# Patient Record
Sex: Female | Born: 1980 | Race: Black or African American | Hispanic: No | Marital: Married | Smoking: Never smoker
Health system: Southern US, Community
[De-identification: ages and names within clinical notes are randomized; demographics above are authoritative.]

## PROBLEM LIST (undated history)

## (undated) DIAGNOSIS — E162 Hypoglycemia, unspecified: Secondary | ICD-10-CM

## (undated) HISTORY — PX: BRAIN SURGERY: SHX531

---

## 2007-03-17 ENCOUNTER — Emergency Department (HOSPITAL_COMMUNITY): Admission: EM | Admit: 2007-03-17 | Discharge: 2007-03-17 | Payer: Self-pay | Admitting: Emergency Medicine

## 2007-04-27 ENCOUNTER — Emergency Department (HOSPITAL_COMMUNITY): Admission: EM | Admit: 2007-04-27 | Discharge: 2007-04-27 | Payer: Self-pay | Admitting: Emergency Medicine

## 2008-02-20 ENCOUNTER — Emergency Department (HOSPITAL_COMMUNITY): Admission: EM | Admit: 2008-02-20 | Discharge: 2008-02-20 | Payer: Self-pay | Admitting: Emergency Medicine

## 2009-02-24 ENCOUNTER — Emergency Department (HOSPITAL_COMMUNITY): Admission: EM | Admit: 2009-02-24 | Discharge: 2009-02-24 | Payer: Self-pay | Admitting: Emergency Medicine

## 2009-02-26 ENCOUNTER — Emergency Department (HOSPITAL_COMMUNITY): Admission: EM | Admit: 2009-02-26 | Discharge: 2009-02-26 | Payer: Self-pay | Admitting: Emergency Medicine

## 2010-04-18 LAB — DIFFERENTIAL
Basophils Absolute: 0.1 10*3/uL (ref 0.0–0.1)
Lymphocytes Relative: 46 % (ref 12–46)
Neutro Abs: 1.8 10*3/uL (ref 1.7–7.7)
Neutrophils Relative %: 39 % — ABNORMAL LOW (ref 43–77)

## 2010-04-18 LAB — POCT I-STAT, CHEM 8
BUN: 10 mg/dL (ref 6–23)
Calcium, Ion: 1.14 mmol/L (ref 1.12–1.32)
Chloride: 101 mEq/L (ref 96–112)
Creatinine, Ser: 1 mg/dL (ref 0.4–1.2)
Glucose, Bld: 82 mg/dL (ref 70–99)
HCT: 41 % (ref 36.0–46.0)
Potassium: 4.2 mEq/L (ref 3.5–5.1)
Sodium: 140 mEq/L (ref 135–145)

## 2010-04-18 LAB — CBC
HCT: 37.2 % (ref 36.0–46.0)
MCHC: 33 g/dL (ref 30.0–36.0)
MCV: 77.8 fL — ABNORMAL LOW (ref 78.0–100.0)
Platelets: 201 10*3/uL (ref 150–400)
RDW: 15.5 % (ref 11.5–15.5)

## 2010-09-25 LAB — URINALYSIS, ROUTINE W REFLEX MICROSCOPIC
Bilirubin Urine: NEGATIVE
Nitrite: NEGATIVE
Protein, ur: 30 — AB
Specific Gravity, Urine: 1.034 — ABNORMAL HIGH
Urobilinogen, UA: 1

## 2010-09-25 LAB — I-STAT 8, (EC8 V) (CONVERTED LAB)
Acid-Base Excess: 3 — ABNORMAL HIGH
BUN: 12
Bicarbonate: 29.1 — ABNORMAL HIGH
Chloride: 105
HCT: 43
Hemoglobin: 14.6
Potassium: 4
Sodium: 139
pCO2, Ven: 47.7

## 2010-09-25 LAB — DIFFERENTIAL
Basophils Absolute: 0
Neutro Abs: 2
Neutrophils Relative %: 41 — ABNORMAL LOW

## 2010-09-25 LAB — URINE MICROSCOPIC-ADD ON

## 2010-09-25 LAB — POCT I-STAT CREATININE
Creatinine, Ser: 1.2
Operator id: 285841

## 2010-09-25 LAB — CBC
Hemoglobin: 12.6
MCV: 77.1 — ABNORMAL LOW
RBC: 5.05

## 2010-09-26 LAB — CBC
HCT: 37.7
Hemoglobin: 12.6
MCV: 76.2 — ABNORMAL LOW
Platelets: 238
RBC: 4.95

## 2010-09-26 LAB — COMPREHENSIVE METABOLIC PANEL
AST: 29
Albumin: 4.1
Alkaline Phosphatase: 56
BUN: 10
CO2: 28
Calcium: 9.3
Creatinine, Ser: 0.87
GFR calc non Af Amer: >60
Total Protein: 8.2

## 2010-09-26 LAB — URINALYSIS, ROUTINE W REFLEX MICROSCOPIC
Bilirubin Urine: NEGATIVE
Ketones, ur: NEGATIVE
Protein, ur: NEGATIVE
Specific Gravity, Urine: 1.022
pH: 6

## 2010-09-26 LAB — URINE MICROSCOPIC-ADD ON

## 2010-09-26 LAB — DIFFERENTIAL
Basophils Relative: 0
Eosinophils Absolute: 0.1
Eosinophils Relative: 1
Lymphocytes Relative: 13
Lymphs Abs: 0.9
Monocytes Relative: 8

## 2011-06-21 ENCOUNTER — Emergency Department (HOSPITAL_COMMUNITY)
Admission: EM | Admit: 2011-06-21 | Discharge: 2011-06-21 | Disposition: A | Discharge status: Home / Self Care | Payer: Self-pay | Attending: Emergency Medicine | Admitting: Emergency Medicine

## 2011-06-21 ENCOUNTER — Encounter (HOSPITAL_COMMUNITY): Payer: Self-pay | Admitting: *Deleted

## 2011-06-21 DIAGNOSIS — R51 Headache: Secondary | ICD-10-CM | POA: Insufficient documentation

## 2011-06-21 LAB — GLUCOSE, CAPILLARY: Glucose-Capillary: 85 mg/dL (ref 70–99)

## 2011-06-21 MED ORDER — PROMETHAZINE HCL 25 MG/ML IJ SOLN
25.0000 mg | Freq: Once | INTRAMUSCULAR | Status: AC
  Administered 2011-06-21: 25 mg via INTRAVENOUS
  Filled 2011-06-21: qty 1

## 2011-06-21 MED ORDER — KETOROLAC TROMETHAMINE 30 MG/ML IJ SOLN
30.0000 mg | Freq: Once | INTRAMUSCULAR | Status: AC
  Administered 2011-06-21: 30 mg via INTRAVENOUS
  Filled 2011-06-21: qty 1

## 2011-06-21 MED ORDER — SODIUM CHLORIDE 0.9 % IV BOLUS (SEPSIS)
500.0000 mL | Freq: Once | INTRAVENOUS | Status: AC
  Administered 2011-06-21: 500 mL via INTRAVENOUS

## 2011-06-21 NOTE — Discharge Instructions (Signed)

## 2011-06-21 NOTE — Progress Notes (Signed)
Pt confirms she self pay [place] resident (not [place]) CM [place]  voiced understanding and appreciation of services and written resources provided

## 2011-06-21 NOTE — ED Notes (Signed)
Pt state she has had a headache for 3 days. Pt c/o n/v . Pt denies any blurred vision or dizziness

## 2011-06-21 NOTE — ED Provider Notes (Signed)
History     CSN: [id number]  Arrival date & time 06/21/11  1218   First MD Initiated Contact with Patient 06/21/11 1502      Chief Complaint  Patient presents with  . Migraine  . Nausea    (Consider location/radiation/quality/duration/timing/severity/associated sxs/prior treatment) Patient is a 31 y.o. female presenting with migraine. The history is provided by the patient.  Migraine This is a recurrent problem. Associated symptoms include headaches. Pertinent negatives include no chest pain, no abdominal pain and no shortness of breath.   patient's had a headache for the last 3 days. She has a previous history of headaches like this headache. She's previously had a CAT scan. She states his been coming on more frequently. She has nausea and vomiting and some photophobia. No fevers. No trauma. No vision changes. No dizziness. She states she has been thirsty all the time. She states she's had some mild urinary frequency. She denies possibility of pregnancy. No pain with urination.  History reviewed. No pertinent past medical history.  History reviewed. No pertinent past surgical history.  No family history on file.  History  Substance Use Topics  . Smoking status: Never Smoker   . Smokeless tobacco: Not on file  . Alcohol Use: No    OB History    Grav Para Term Preterm Abortions TAB SAB Ect Mult Living                  Review of Systems  Constitutional: Negative for activity change, appetite change and fatigue.  HENT: Negative for neck stiffness.   Eyes: Positive for photophobia. Negative for pain.  Respiratory: Negative for chest tightness and shortness of breath.   Cardiovascular: Negative for chest pain and leg swelling.  Gastrointestinal: Positive for nausea and vomiting. Negative for abdominal pain and diarrhea.  Genitourinary: Positive for frequency. Negative for dysuria and flank pain.  Musculoskeletal: Negative for back pain.  Skin: Negative for rash.    Neurological: Positive for headaches. Negative for weakness and numbness.  Psychiatric/Behavioral: Negative for behavioral problems.    Allergies  Penicillins  Home Medications   Current Outpatient Rx  Name Route Sig Dispense Refill  . IBUPROFEN 200 MG PO TABS Oral Take 800 mg by mouth every 6 (six) hours as needed. Pain      BP 117/51  Pulse 54  Temp 98.2 F (36.8 C) (Oral)  Resp 16  SpO2 95%  LMP 09/21/2010  Physical Exam  Nursing note and vitals reviewed. Constitutional: She is oriented to person, place, and time. She appears well-developed and well-nourished.  HENT:  Head: Normocephalic and atraumatic.       Mild tenderness over bilateral foreheads.   Eyes: EOM are normal. Pupils are equal, round, and reactive to light.  Neck: Normal range of motion. Neck supple.  Cardiovascular: Normal rate, regular rhythm and normal heart sounds.   No murmur heard. Pulmonary/Chest: Effort normal and breath sounds normal. No respiratory distress. She has no wheezes. She has no rales.  Abdominal: Soft. Bowel sounds are normal. She exhibits no distension. There is no tenderness. There is no rebound and no guarding.  Musculoskeletal: Normal range of motion.  Neurological: She is alert and oriented to person, place, and time. No cranial nerve deficit.  Skin: Skin is warm and dry.  Psychiatric: She has a normal mood and affect. Her speech is normal.    ED Course  Procedures (including critical care time)   Labs Reviewed  GLUCOSE, CAPILLARY   No results found.  1. Headache       MDM  Patient with headache for the last 3 days. Nausea vomiting. No blurred vision or dizziness. Patient feels better after treatment will be discharged home. She's been previously worked up for headaches.        Juliet Rude. Rubin Payor, MD 06/21/11 2318

## 2015-04-04 ENCOUNTER — Emergency Department (HOSPITAL_COMMUNITY)
Admission: EM | Admit: 2015-04-04 | Discharge: 2015-04-04 | Disposition: A | Discharge status: Home / Self Care | Payer: 59 | Attending: Emergency Medicine | Admitting: Emergency Medicine

## 2015-04-04 ENCOUNTER — Encounter (HOSPITAL_COMMUNITY): Payer: Self-pay | Admitting: *Deleted

## 2015-04-04 DIAGNOSIS — B349 Viral infection, unspecified: Secondary | ICD-10-CM | POA: Diagnosis not present

## 2015-04-04 DIAGNOSIS — Z880 Allergy status to penicillin: Secondary | ICD-10-CM | POA: Insufficient documentation

## 2015-04-04 DIAGNOSIS — R1084 Generalized abdominal pain: Secondary | ICD-10-CM | POA: Diagnosis not present

## 2015-04-04 DIAGNOSIS — R05 Cough: Secondary | ICD-10-CM | POA: Diagnosis present

## 2015-04-04 LAB — URINE MICROSCOPIC-ADD ON: RBC / HPF: NONE SEEN RBC/hpf (ref 0–5)

## 2015-04-04 LAB — CBC WITH DIFFERENTIAL/PLATELET
Basophils Absolute: 0 10*3/uL (ref 0.0–0.1)
Basophils Relative: 1 %
EOS ABS: 0.2 10*3/uL (ref 0.0–0.7)
EOS PCT: 5 %
HCT: 39.8 % (ref 36.0–46.0)
Hemoglobin: 12.2 g/dL (ref 12.0–15.0)
LYMPHS ABS: 1.2 10*3/uL (ref 0.7–4.0)
LYMPHS PCT: 33 %
MCH: 24.6 pg — AB (ref 26.0–34.0)
MCHC: 30.7 g/dL (ref 30.0–36.0)
MCV: 80.2 fL (ref 78.0–100.0)
MONO ABS: 0.6 10*3/uL (ref 0.1–1.0)
MONOS PCT: 16 %
Neutro Abs: 1.6 10*3/uL — ABNORMAL LOW (ref 1.7–7.7)
Neutrophils Relative %: 45 %
PLATELETS: 188 10*3/uL (ref 150–400)
RBC: 4.96 MIL/uL (ref 3.87–5.11)
RDW: 14.2 % (ref 11.5–15.5)
WBC: 3.6 10*3/uL — AB (ref 4.0–10.5)

## 2015-04-04 LAB — BASIC METABOLIC PANEL
Anion gap: 10 (ref 5–15)
BUN: 7 mg/dL (ref 6–20)
CHLORIDE: 103 mmol/L (ref 101–111)
CO2: 27 mmol/L (ref 22–32)
CREATININE: 0.95 mg/dL (ref 0.44–1.00)
Calcium: 9.1 mg/dL (ref 8.9–10.3)
GFR calc Af Amer: >60 mL/min (ref >60)
GFR calc non Af Amer: >60 mL/min (ref >60)
GLUCOSE: 78 mg/dL (ref 65–99)
POTASSIUM: 3.7 mmol/L (ref 3.5–5.1)
SODIUM: 140 mmol/L (ref 135–145)

## 2015-04-04 LAB — URINALYSIS, ROUTINE W REFLEX MICROSCOPIC
Bilirubin Urine: NEGATIVE
Glucose, UA: NEGATIVE mg/dL
Hgb urine dipstick: NEGATIVE
KETONES UR: NEGATIVE mg/dL
LEUKOCYTES UA: NEGATIVE
NITRITE: NEGATIVE
PROTEIN: 30 mg/dL — AB
Specific Gravity, Urine: 1.027 (ref 1.005–1.030)
pH: 6.5 (ref 5.0–8.0)

## 2015-04-04 LAB — LIPASE, BLOOD: LIPASE: 27 U/L (ref 11–51)

## 2015-04-04 LAB — PREGNANCY, URINE: PREG TEST UR: NEGATIVE

## 2015-04-04 MED ORDER — OSELTAMIVIR PHOSPHATE 75 MG PO CAPS: 75.0000 mg | ORAL_CAPSULE | Freq: Two times a day (BID) | ORAL | Status: AC

## 2015-04-04 MED ORDER — ONDANSETRON HCL 4 MG/2ML IJ SOLN
4.0000 mg | Freq: Once | INTRAMUSCULAR | Status: AC
  Administered 2015-04-04: 4 mg via INTRAVENOUS
  Filled 2015-04-04: qty 2

## 2015-04-04 MED ORDER — SODIUM CHLORIDE 0.9 % IV BOLUS (SEPSIS)
1000.0000 mL | Freq: Once | INTRAVENOUS | Status: AC
  Administered 2015-04-04: 1000 mL via INTRAVENOUS

## 2015-04-04 MED ORDER — ONDANSETRON 4 MG PO TBDP: 4.0000 mg | ORAL_TABLET | Freq: Three times a day (TID) | ORAL | Status: AC | PRN

## 2015-04-04 NOTE — Discharge Instructions (Signed)
1. Medications: Tamiflu, zofran for nausea as needed, continue usual home medications 2. Treatment: rest, drink plenty of fluids 3. Follow Up: Please follow up with your primary doctor in 2-3 days if no improvement for discussion of your diagnoses and further evaluation after today's visit; Please return to the ER for new or worsening symptoms, any additional concerns.

## 2015-04-04 NOTE — ED Provider Notes (Signed)
CSN: [id number]     Arrival date & time 04/04/15  1047 History  By signing my name below, I, Carla Beck, attest that this documentation has been prepared under the direction and in the presence of Carla Sauer, PA-C Electronically Signed: Soijett Beck, ED Scribe. 04/04/2015. 1:17 PM.    Chief Complaint  Patient presents with  . Influenza    The history is provided by the patient. No language interpreter was used.    Carla Beck is a 35 y.o. female who presents to the Emergency Department complaining of flu-like symptoms onset yesterday. Pt notes that her symptoms initially began with cold-like symptoms and NBNB emesis since yesterday. She states that she is having gradual onset associated symptoms of generalized body aches, productive cough x this morning, HA, nausea, vomiting x 3 episodes today, nasal congestion, sore throat, and mild abdominal pain. Pt reports that lying on her side alleviates her symptoms. She states that she has tried ibuprofen with mild relief for her symptoms. She denies fever, chills, trouble swallowing, hematemesis, diarrhea, constipation, and any other symptoms.  History reviewed. No pertinent past medical history. History reviewed. No pertinent past surgical history. History reviewed. No pertinent family history. Social History  Substance Use Topics  . Smoking status: Never Smoker   . Smokeless tobacco: None  . Alcohol Use: No   OB History    No data available     Review of Systems  HENT: Positive for congestion and sore throat.   Respiratory: Positive for cough.   Gastrointestinal: Positive for nausea and vomiting.  Musculoskeletal: Positive for myalgias.  Neurological: Positive for headaches.  All other systems reviewed and are negative.     Allergies  Ceclor and Penicillins  Home Medications   Prior to Admission medications   Medication Sig Start Date End Date Taking? Authorizing Provider  ibuprofen (ADVIL,MOTRIN) 200 MG tablet Take 800  mg by mouth every 6 (six) hours as needed. Pain    Historical Provider, MD  ondansetron (ZOFRAN ODT) 4 MG disintegrating tablet Take 1 tablet (4 mg total) by mouth every 8 (eight) hours as needed for nausea or vomiting. 04/04/15   Carla Picket Allyse Fregeau, PA-C  oseltamivir (TAMIFLU) 75 MG capsule Take 1 capsule (75 mg total) by mouth every 12 (twelve) hours. 04/04/15   Carla Papadakis Pilcher Jupiter Boys, PA-C   BP 100/59 mmHg  Pulse 60  Temp(Src) 98.3 F (36.8 C) (Oral)  Resp 16  SpO2 100%  LMP 03/06/2015 Physical Exam  Constitutional: She is oriented to person, place, and time. She appears well-developed and well-nourished. No distress.  HENT:  Head: Normocephalic and atraumatic.  OP with erythema, no exudates or tonsillar hypertrophy. + nasal congestion with mucosal edema.   Eyes: EOM are normal.  Neck: Normal range of motion. Neck supple.  No meningeal signs.   Cardiovascular: Normal rate, regular rhythm and normal heart sounds.  Exam reveals no gallop and no friction rub.   No murmur heard. Pulmonary/Chest: Effort normal and breath sounds normal. No respiratory distress. She has no wheezes. She has no rales.  Lungs are clear to auscultation bilaterally - no w/r/r  Abdominal: Soft. Bowel sounds are normal. She exhibits no distension and no mass. There is tenderness (Generalized). There is no guarding.  Musculoskeletal: Normal range of motion.  Neurological: She is alert and oriented to person, place, and time.  Skin: Skin is warm and dry. She is not diaphoretic.  Psychiatric: She has a normal mood and affect. Her behavior is normal.  Nursing note  and vitals reviewed.  ED Course  Procedures (including critical care time) DIAGNOSTIC STUDIES: Oxygen Saturation is 98% on RA, nl by my interpretation.    COORDINATION OF CARE: 1:16 PM Discussed treatment plan with pt at bedside which includes UA, labs, and pt agreed to plan.  Labs Review Labs Reviewed  CBC WITH DIFFERENTIAL/PLATELET - Abnormal; Notable  for the following:    WBC 3.6 (*)    MCH 24.6 (*)    Neutro Abs 1.6 (*)    All other components within normal limits  URINALYSIS, ROUTINE W REFLEX MICROSCOPIC (NOT AT [facility]) - Abnormal; Notable for the following:    Color, Urine AMBER (*)    APPearance TURBID (*)    Protein, ur 30 (*)    All other components within normal limits  URINE MICROSCOPIC-ADD ON - Abnormal; Notable for the following:    Squamous Epithelial / LPF 6-30 (*)    Bacteria, UA MANY (*)    All other components within normal limits  BASIC METABOLIC PANEL  PREGNANCY, URINE  LIPASE, BLOOD    Imaging Review No results found. I have personally reviewed and evaluated these lab results as part of my medical decision-making.   EKG Interpretation None      MDM   Final diagnoses:  Viral syndrome   Carla Beck presents with abdominal pain, nausea, and vomiting along with cough, congestion since yesterday. On exam, patient with nonsurgical abdomen which is soft, nondistended with generalized abdominal tenderness. Tacky mucous membranes. Oropharynx with erythema but no exudates or hypertrophy. Likely viral syndrome. CBC, BMP, lipase, UA, Upreg reviewed and reassuring. 1 L fluids and Zofran given.  3:25 PM - Patient re-evaluated and feels much improved. No episodes of emesis while in ED.   Plan: symptomatic care, PCP follow up strongly encouraged. Return precautions given. All questions answered.   I personally performed the services described in this documentation, which was scribed in my presence. The recorded information has been reviewed and is accurate.   [facility] CenterJaime Pilcher Johntay Doolen, PA-C 04/04/15 1652  Carla LovelessScott Goldston, MD 04/05/15 1149

## 2015-04-04 NOTE — ED Notes (Signed)
Pt reports flu like symptoms since Saturday. Mask on pt at triage. Reports bodyaches, headache, fever, n/v.

## 2015-04-04 NOTE — ED Notes (Signed)
PA at bedside.

## 2015-04-04 NOTE — ED Notes (Signed)
Pt reports cough, congestion, nasal congestion, n/v. NAD noted. Pt ambulatory to room.

## 2015-10-10 ENCOUNTER — Encounter (HOSPITAL_COMMUNITY): Payer: Self-pay

## 2015-10-10 ENCOUNTER — Encounter (HOSPITAL_COMMUNITY): Payer: Self-pay | Admitting: *Deleted

## 2015-10-10 ENCOUNTER — Emergency Department (HOSPITAL_COMMUNITY): Payer: 59

## 2015-10-10 ENCOUNTER — Emergency Department (HOSPITAL_COMMUNITY)
Admission: EM | Admit: 2015-10-10 | Discharge: 2015-10-10 | Disposition: A | Discharge status: Home / Self Care | Payer: 59 | Attending: Emergency Medicine | Admitting: Emergency Medicine

## 2015-10-10 DIAGNOSIS — Z5321 Procedure and treatment not carried out due to patient leaving prior to being seen by health care provider: Secondary | ICD-10-CM | POA: Insufficient documentation

## 2015-10-10 DIAGNOSIS — R112 Nausea with vomiting, unspecified: Secondary | ICD-10-CM | POA: Insufficient documentation

## 2015-10-10 DIAGNOSIS — R51 Headache: Secondary | ICD-10-CM | POA: Insufficient documentation

## 2015-10-10 DIAGNOSIS — R519 Headache, unspecified: Secondary | ICD-10-CM

## 2015-10-10 DIAGNOSIS — R109 Unspecified abdominal pain: Secondary | ICD-10-CM | POA: Insufficient documentation

## 2015-10-10 DIAGNOSIS — R1110 Vomiting, unspecified: Secondary | ICD-10-CM | POA: Insufficient documentation

## 2015-10-10 HISTORY — DX: Hypoglycemia, unspecified: E16.2

## 2015-10-10 LAB — CBC
HCT: 40.5 % (ref 36.0–46.0)
HEMATOCRIT: 38.8 % (ref 36.0–46.0)
Hemoglobin: 12.3 g/dL (ref 12.0–15.0)
Hemoglobin: 11.9 g/dL — ABNORMAL LOW (ref 12.0–15.0)
MCH: 24.7 pg — AB (ref 26.0–34.0)
MCH: 24.8 pg — AB (ref 26.0–34.0)
MCHC: 30.4 g/dL (ref 30.0–36.0)
MCHC: 30.7 g/dL (ref 30.0–36.0)
MCV: 81.3 fL (ref 78.0–100.0)
MCV: 80.8 fL (ref 78.0–100.0)
PLATELETS: 201 10*3/uL (ref 150–400)
PLATELETS: 192 10*3/uL (ref 150–400)
RBC: 4.98 MIL/uL (ref 3.87–5.11)
RBC: 4.8 MIL/uL (ref 3.87–5.11)
RDW: 14.4 % (ref 11.5–15.5)
RDW: 14.2 % (ref 11.5–15.5)
WBC: 4.1 10*3/uL (ref 4.0–10.5)
WBC: 4.8 10*3/uL (ref 4.0–10.5)

## 2015-10-10 LAB — COMPREHENSIVE METABOLIC PANEL
ALBUMIN: 3.9 g/dL (ref 3.5–5.0)
ALBUMIN: 3.7 g/dL (ref 3.5–5.0)
ALK PHOS: 48 U/L (ref 38–126)
ALT: 10 U/L — AB (ref 14–54)
ALT: 10 U/L — AB (ref 14–54)
AST: 15 U/L (ref 15–41)
AST: 14 U/L — AB (ref 15–41)
Alkaline Phosphatase: 44 U/L (ref 38–126)
Anion gap: 8 (ref 5–15)
Anion gap: 9 (ref 5–15)
BUN: 6 mg/dL (ref 6–20)
BUN: 6 mg/dL (ref 6–20)
CALCIUM: 9.2 mg/dL (ref 8.9–10.3)
CHLORIDE: 105 mmol/L (ref 101–111)
CO2: 27 mmol/L (ref 22–32)
CO2: 25 mmol/L (ref 22–32)
CREATININE: 0.99 mg/dL (ref 0.44–1.00)
CREATININE: 1.01 mg/dL — AB (ref 0.44–1.00)
Calcium: 8.8 mg/dL — ABNORMAL LOW (ref 8.9–10.3)
Chloride: 105 mmol/L (ref 101–111)
GFR calc Af Amer: >60 mL/min (ref >60)
GFR calc Af Amer: >60 mL/min (ref >60)
GFR calc non Af Amer: >60 mL/min (ref >60)
GLUCOSE: 89 mg/dL (ref 65–99)
Glucose, Bld: 84 mg/dL (ref 65–99)
POTASSIUM: 3.9 mmol/L (ref 3.5–5.1)
Potassium: 4 mmol/L (ref 3.5–5.1)
SODIUM: 140 mmol/L (ref 135–145)
SODIUM: 139 mmol/L (ref 135–145)
Total Bilirubin: 0.4 mg/dL (ref 0.3–1.2)
Total Bilirubin: 0.4 mg/dL (ref 0.3–1.2)
Total Protein: 7.6 g/dL (ref 6.5–8.1)
Total Protein: 7.3 g/dL (ref 6.5–8.1)

## 2015-10-10 LAB — I-STAT BETA HCG BLOOD, ED (MC, WL, AP ONLY)

## 2015-10-10 LAB — URINALYSIS, ROUTINE W REFLEX MICROSCOPIC
Bilirubin Urine: NEGATIVE
GLUCOSE, UA: NEGATIVE mg/dL
HGB URINE DIPSTICK: NEGATIVE
Ketones, ur: NEGATIVE mg/dL
LEUKOCYTES UA: NEGATIVE
Nitrite: NEGATIVE
PH: 6.5 (ref 5.0–8.0)
Protein, ur: NEGATIVE mg/dL
Specific Gravity, Urine: 1.024 (ref 1.005–1.030)

## 2015-10-10 LAB — LIPASE, BLOOD: Lipase: 22 U/L (ref 11–51)

## 2015-10-10 MED ORDER — KETOROLAC TROMETHAMINE 30 MG/ML IJ SOLN
30.0000 mg | Freq: Once | INTRAMUSCULAR | Status: AC
  Administered 2015-10-10: 30 mg via INTRAVENOUS
  Filled 2015-10-10: qty 1

## 2015-10-10 MED ORDER — ONDANSETRON 4 MG PO TBDP
4.0000 mg | ORAL_TABLET | Freq: Once | ORAL | Status: AC | PRN
  Administered 2015-10-10: 4 mg via ORAL

## 2015-10-10 MED ORDER — SODIUM CHLORIDE 0.9 % IV BOLUS (SEPSIS)
1000.0000 mL | Freq: Once | INTRAVENOUS | Status: AC
  Administered 2015-10-10: 1000 mL via INTRAVENOUS

## 2015-10-10 MED ORDER — METOCLOPRAMIDE HCL 5 MG/ML IJ SOLN
10.0000 mg | Freq: Once | INTRAMUSCULAR | Status: AC
  Administered 2015-10-10: 10 mg via INTRAVENOUS
  Filled 2015-10-10: qty 2

## 2015-10-10 MED ORDER — DIPHENHYDRAMINE HCL 50 MG/ML IJ SOLN
25.0000 mg | Freq: Once | INTRAMUSCULAR | Status: AC
  Administered 2015-10-10: 25 mg via INTRAVENOUS
  Filled 2015-10-10: qty 1

## 2015-10-10 MED ORDER — ONDANSETRON 4 MG PO TBDP
ORAL_TABLET | ORAL | Status: AC
  Filled 2015-10-10: qty 1

## 2015-10-10 NOTE — ED Triage Notes (Signed)
Pt states 10 episodes of vomiting since 1430 yesterday and frontal headache since 1900.  Denies blurred vision or other neuro s/s.  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

## 2015-10-10 NOTE — ED Provider Notes (Signed)
[facility]-EMERGENCY DEPT Provider Note   CSN: [id number] Arrival date & time: 10/10/15  0935  History   Chief Complaint Chief Complaint  Patient presents with  . Emesis  . Headache    HPI Relda Agosto is a 35 y.o. female.  HPI  35 y.o. female presents to the Emergency Department today complaining of headache and emesis since yesterday. Notes emesis x 10 yesterday. None today. Associated headache as well. Notes hx of headaches in past, but none as bad as this. Rates frontal headache 9/10. No relief with Excedrin. No vision changes. No dizziness. No syncope. Does not endorse any URi symptoms. No diarrhea. No CP/SOB/ABD pain. No fevers. No pain with ROM of neck. No recent sick contacts. No other symptoms noted.    Past Medical History:  Diagnosis Date  . Hypoglycemia     There are no active problems to display for this patient.   Past Surgical History:  Procedure Laterality Date  . BRAIN SURGERY      OB History    No data available       Home Medications    Prior to Admission medications   Medication Sig Start Date End Date Taking? Authorizing Provider  ibuprofen (ADVIL,MOTRIN) 200 MG tablet Take 800 mg by mouth every 6 (six) hours as needed. Pain    Historical Provider, MD  ondansetron (ZOFRAN ODT) 4 MG disintegrating tablet Take 1 tablet (4 mg total) by mouth every 8 (eight) hours as needed for nausea or vomiting. 04/04/15   Chase Picket Ward, PA-C  oseltamivir (TAMIFLU) 75 MG capsule Take 1 capsule (75 mg total) by mouth every 12 (twelve) hours. 04/04/15   Chase Picket Ward, PA-C    Family History No family history on file.  Social History Social History  Substance Use Topics  . Smoking status: Never Smoker  . Smokeless tobacco: Never Used  . Alcohol use No     Allergies   Ceclor [cefaclor]; Other; and Penicillins   Review of Systems Review of Systems ROS reviewed and all are negative for acute change except as noted in the HPI.  Physical Exam Updated  Vital Signs BP (!) 105/52 (BP Location: Right Arm)   Pulse 61   Temp 98.4 F (36.9 C) (Oral)   Resp 16   LMP 09/18/2015   SpO2 95%   Physical Exam  Constitutional: She is oriented to person, place, and time. Vital signs are normal. She appears well-developed and well-nourished.  HENT:  Head: Normocephalic and atraumatic.  Right Ear: Hearing normal.  Left Ear: Hearing normal.  Eyes: Conjunctivae and EOM are normal. Pupils are equal, round, and reactive to light.  Neck: Normal range of motion. Neck supple.  Cardiovascular: Normal rate, regular rhythm, normal heart sounds and intact distal pulses.   Pulmonary/Chest: Effort normal and breath sounds normal.  Neurological: She is alert and oriented to person, place, and time. She has normal strength. No cranial nerve deficit or sensory deficit.  Cranial Nerves:  II: Pupils equal, round, reactive to light III,IV, VI: ptosis not present, extra-ocular motions intact bilaterally  V,VII: smile symmetric, facial light touch sensation equal VIII: hearing grossly normal bilaterally  IX,X: midline uvula rise  XI: bilateral shoulder shrug equal and strong XII: midline tongue extension  Skin: Skin is warm and dry.  Psychiatric: She has a normal mood and affect. Her speech is normal and behavior is normal. Thought content normal.  Nursing note and vitals reviewed.  ED Treatments / Results  Labs (all labs ordered are  listed, but only abnormal results are displayed) Labs Reviewed  COMPREHENSIVE METABOLIC PANEL - Abnormal; Notable for the following:       Result Value   ALT 10 (*)    All other components within normal limits  CBC - Abnormal; Notable for the following:    MCH 24.7 (*)    All other components within normal limits  I-STAT BETA HCG BLOOD, ED ([facility], [facility], [facility] ONLY)   EKG  EKG Interpretation None      Radiology Ct Head Wo Contrast  Result Date: 10/10/2015 CLINICAL DATA:  Headache.  Nausea and vomiting EXAM: CT HEAD WITHOUT  CONTRAST TECHNIQUE: Contiguous axial images were obtained from the base of the skull through the vertex without intravenous contrast. COMPARISON:  02/26/2009 FINDINGS: Brain: No acute cortical infarct, hemorrhage, or mass lesion ispresent. Ventricles are of normal size. No significant extra-axial fluid collection is present. The paranasal sinuses andmastoid air cells are clear. The osseous skull is intact. Vascular: No hyperdense vessel or unexpected calcification. Skull: Normal. Negative for fracture or focal lesion. Sinuses/Orbits: No acute finding. Other: None. IMPRESSION: 1. Normal brain. Electronically Signed   By: Signa Kellaylor  Stroud M.D.   On: 10/10/2015 13:01    Procedures Procedures (including critical care time)  Medications Ordered in ED Medications  ondansetron (ZOFRAN-ODT) 4 MG disintegrating tablet (not administered)  ondansetron (ZOFRAN-ODT) disintegrating tablet 4 mg (4 mg Oral Given 10/10/15 0953)   Initial Impression / Assessment and Plan / ED Course  I have reviewed the triage vital signs and the nursing notes.  Pertinent labs & imaging results that were available during my care of the patient were reviewed by me and considered in my medical decision making (see chart for details).  Clinical Course   Final Clinical Impressions(s) / ED Diagnoses  I have reviewed and evaluated the relevant laboratory values I have reviewed and evaluated the relevant imaging studies.  I have reviewed the relevant previous healthcare records.I obtained HPI from historian.  ED Course:  Assessment: Patient is a 35yF that presents with headache since yesterday with associated N/V. No emesis today. Patient is without high-risk features of headache including: Sudden onset/thunderclap HA, No similar headache in past, Altered mental status, Accompanying seizure, Headache with exertion, Age > 50, History of immunocompromise, Neck or shoulder pain, Fever, Use of anticoagulation, Family history of spontaneous  SAH, Concomitant drug use, Toxic exposure.  Patient has a normal complete neurological exam, normal vital signs, normal level of consciousness, no signs of meningismus, is well-appearing/non-toxic appearing, no signs of trauma. No papilledema, no pain over the temporal arteries. Due to no headache similar in past and hx pituitary tumor that was resected, CT ordered, which was unremarkable. No dangerous or life-threatening conditions suspected or identified by history, physical exam, and by work-up. No indications for hospitalization identified.  Improved symptoms with Toradol, Reglan, benadryl. Will DC home with follow up to PCP  Disposition/Plan:  DC Home Additional Verbal discharge instructions given and discussed with patient.  Pt Instructed to f/u with PCP in the next week for evaluation and treatment of symptoms. Return precautions given Pt acknowledges and agrees with plan  Supervising Physician Loren Raceravid Yelverton, MD   Final diagnoses:  Nonintractable headache, unspecified chronicity pattern, unspecified headache type    New Prescriptions New Prescriptions   No medications on file     Audry Piliyler Ilayda Toda, PA-C 10/10/15 1358    Loren Raceravid Yelverton, MD 10/15/15 1304

## 2015-10-10 NOTE — ED Triage Notes (Signed)
Pt states seen here earlier today for migraine and nausea/vomiting. Pt states went home and continued to vomit. Pt states noticed clots in emesis. Pt complaining of mid abdominal pain. Pt denies any urinary symptoms or diarrhea.

## 2015-10-10 NOTE — Discharge Instructions (Signed)
Please read and follow all provided instructions.  Your diagnoses today include:  1. Nonintractable headache, unspecified chronicity pattern, unspecified headache type    Tests performed today include: CT of your head which was normal and did not show any serious cause of your headache Vital signs. See below for your results today.   Medications:  In the Emergency Department you received: Reglan - antinausea/headache medication Benadryl - antihistamine to counteract potential side effects of reglan Toradol - NSAID medication similar to ibuprofen  Take any prescribed medications only as directed.  Additional information:  Follow any educational materials contained in this packet.  You are having a headache. No specific cause was found today for your headache. It may have been a migraine or other cause of headache. Stress, anxiety, fatigue, and depression are common triggers for headaches.   Your headache today does not appear to be life-threatening or require hospitalization, but often the exact cause of headaches is not determined in the emergency department. Therefore, follow-up with your doctor is very important to find out what may have caused your headache and whether or not you need any further diagnostic testing or treatment.   Sometimes headaches can appear benign (not harmful), but then more serious symptoms can develop which should prompt an immediate re-evaluation by your doctor or the emergency department.  BE VERY CAREFUL not to take multiple medicines containing Tylenol (also called acetaminophen). Doing so can lead to an overdose which can damage your liver and cause liver failure and possibly death.   Follow-up instructions: Please follow-up with your primary care provider in the next 3 days for further evaluation of your symptoms.   Return instructions:  Please return to the Emergency Department if you experience worsening symptoms. Return if the medications do not  resolve your headache, if it recurs, or if you have multiple episodes of vomiting or cannot keep down fluids. Return if you have a change from the usual headache. RETURN IMMEDIATELY IF you: Develop a sudden, severe headache Develop confusion or become poorly responsive or faint Develop a fever above 100.2F or problem breathing Have a change in speech, vision, swallowing, or understanding Develop new weakness, numbness, tingling, incoordination in your arms or legs Have a seizure Please return if you have any other emergent concerns.  Additional Information:  Your vital signs today were: BP 104/74    Pulse 79    Temp 98.4 F (36.9 C) (Oral)    Resp 16    LMP 09/18/2015    SpO2 96%  If your blood pressure (BP) was elevated above 135/85 this visit, please have this repeated by your doctor within one month. --------------

## 2015-10-11 ENCOUNTER — Emergency Department (HOSPITAL_COMMUNITY)
Admission: EM | Admit: 2015-10-11 | Discharge: 2015-10-11 | Disposition: A | Discharge status: Home / Self Care | Payer: 59 | Source: Home / Self Care

## 2015-10-11 NOTE — ED Notes (Signed)
Pt called for in waiting area for room assignment x2 No answer

## 2018-04-23 IMAGING — CT CT HEAD W/O CM
4 series · 16 of 47 positions shown, 18 images · non-contrast
Comparison: 02/26/2009

CLINICAL DATA: Headache.  Nausea and vomiting

EXAM:
CT HEAD WITHOUT CONTRAST
TECHNIQUE: Contiguous axial images were obtained from the base of the skull
through the vertex without intravenous contrast.

[Series 2: head without · axial · non-contrast · 0.45mm/px · z∈[+871,+991]mm · 7 of 32 slices shown, 9 images]
[im 4/32  brain]
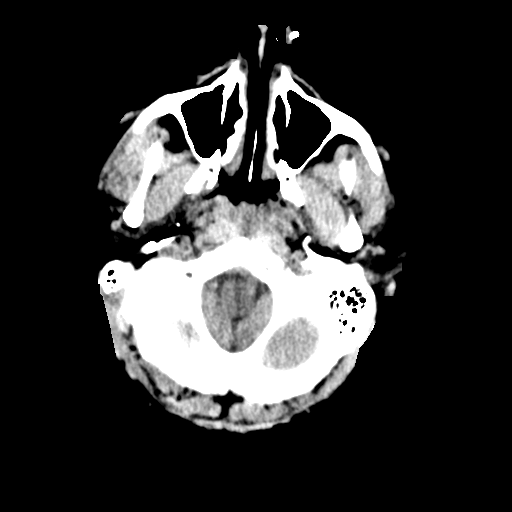
[im 4/32  bone]
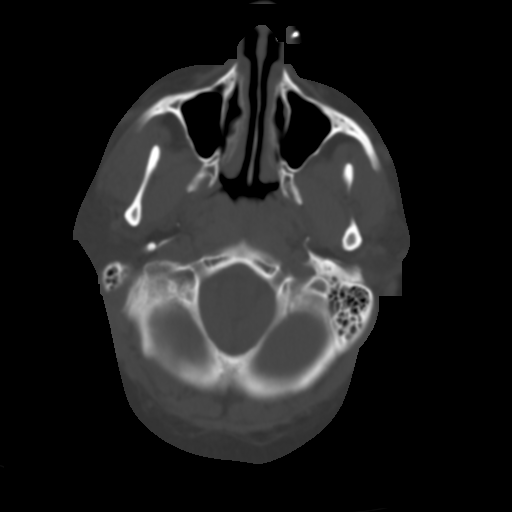
[im 8/32  brain]
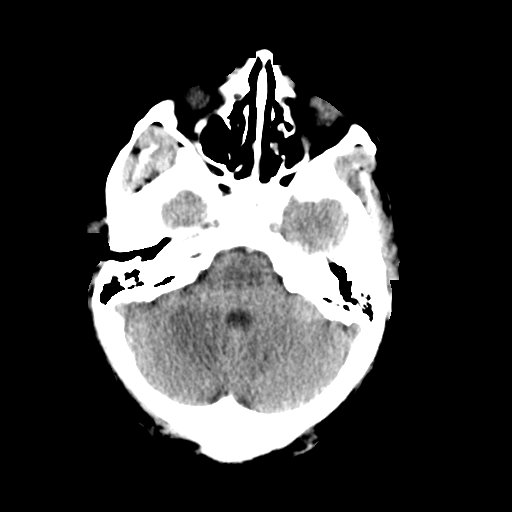
[im 12/32  brain]
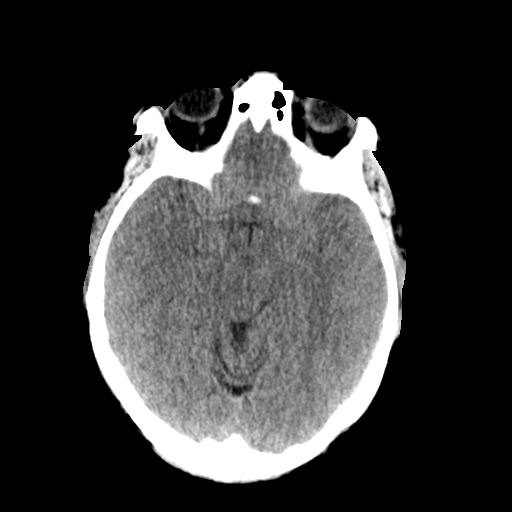
[im 16/32  brain]
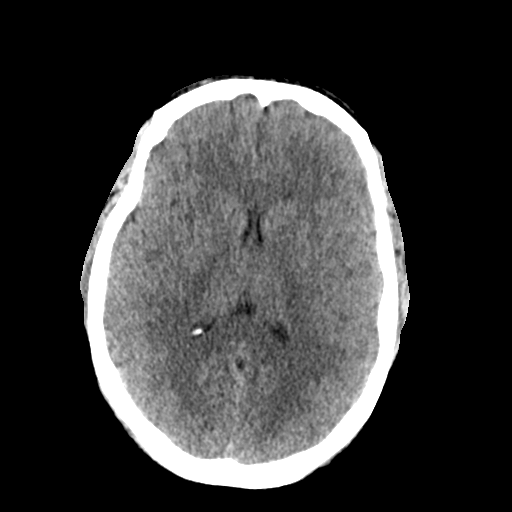
[im 20/32  brain]
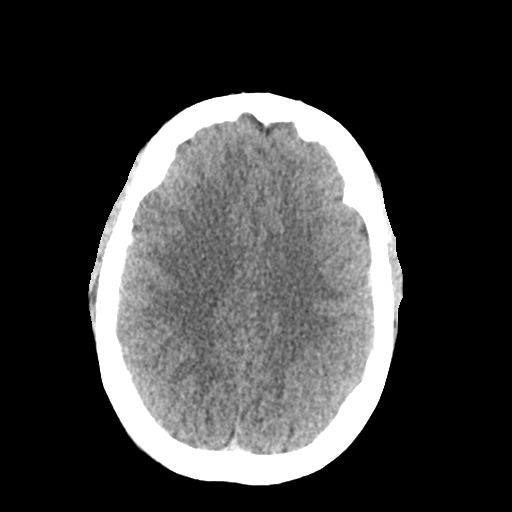
[im 20/32  bone]
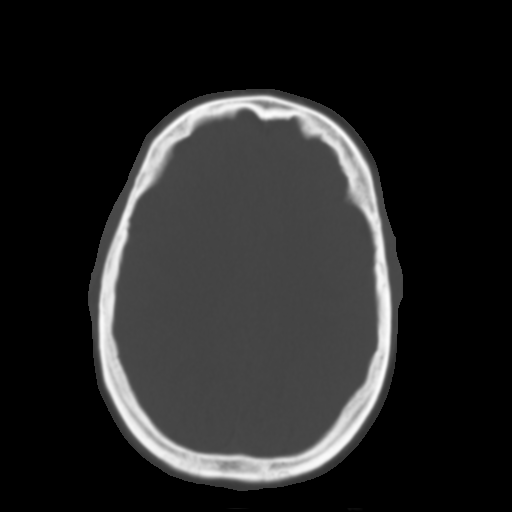
[im 24/32  brain]
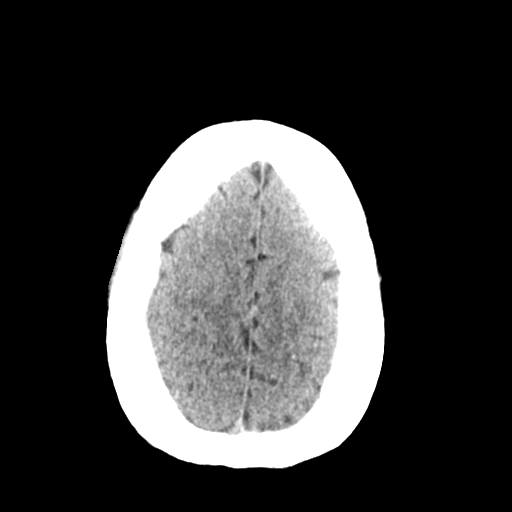
[im 28/32  brain]
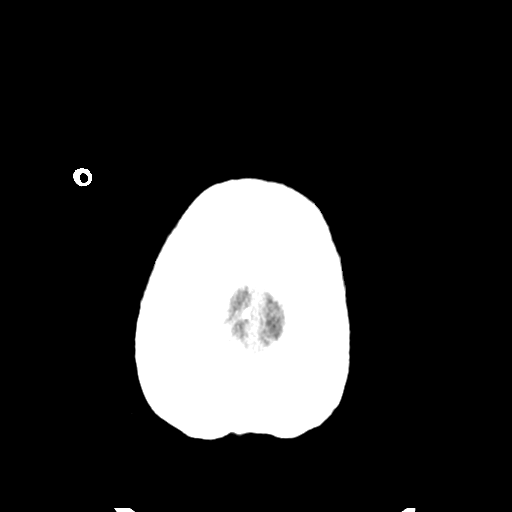

[Series 3: head bone · axial · 0.45mm/px · z∈[+870,+902]mm · 3 of 80 slices shown]
[im 8/80  bone]
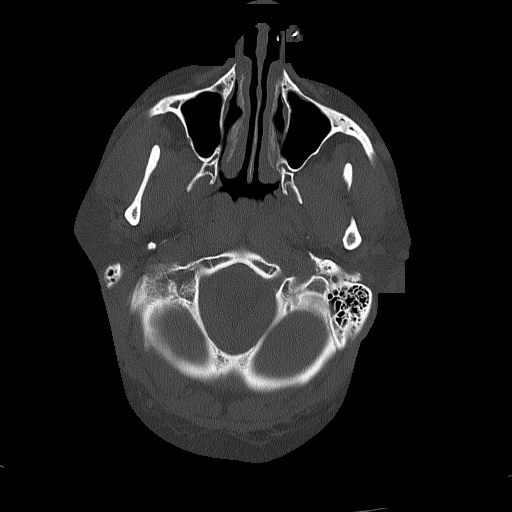
[im 16/80  bone]
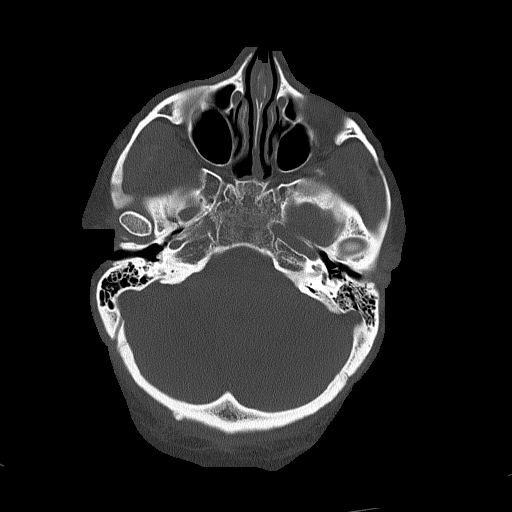
[im 24/80  bone]
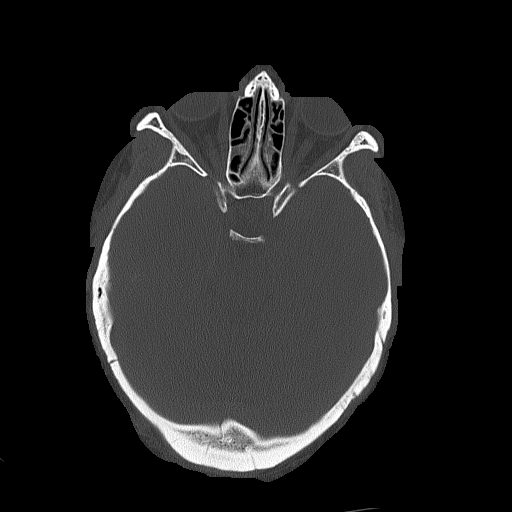

[Series 4: head without cor · coronal · non-contrast · 0.26mm/px · 3 of 67 slices shown]
[im 23/67  brain]
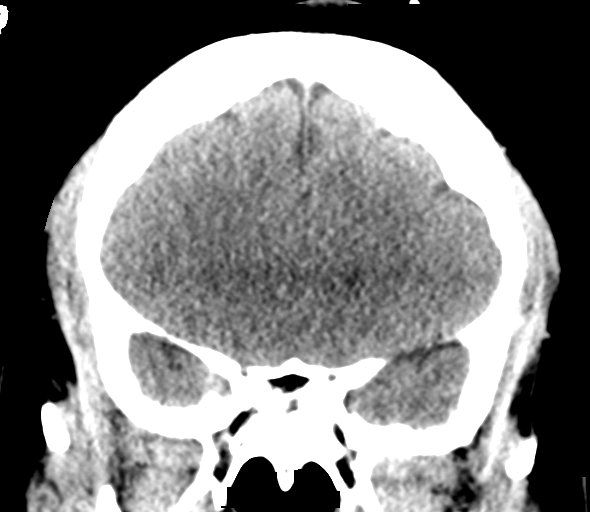
[im 30/67  brain]
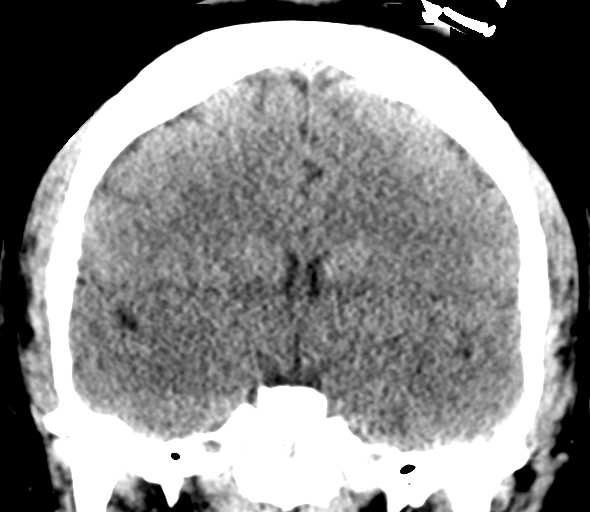
[im 37/67  brain]
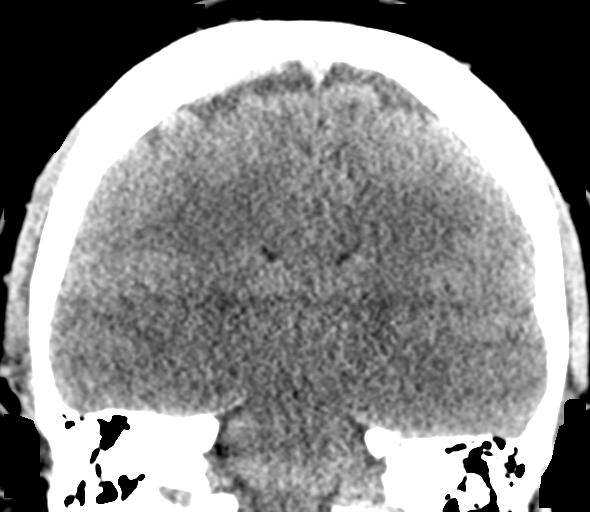

[Series 5: head without sag · sagittal · non-contrast · 0.31mm/px · 3 of 67 slices shown]
[im 23/67  brain]
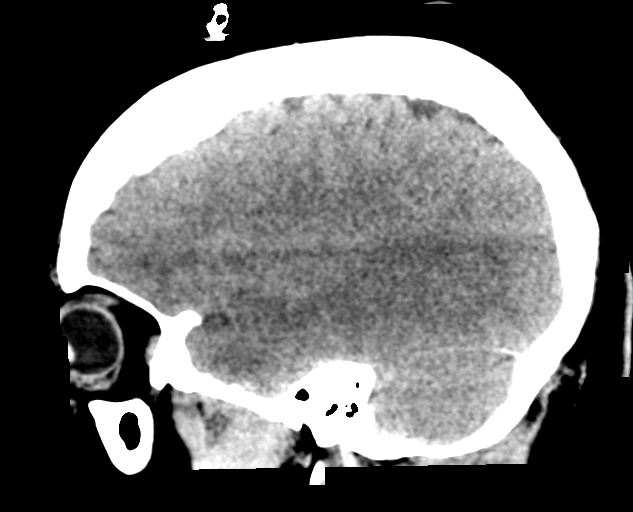
[im 34/67  brain]
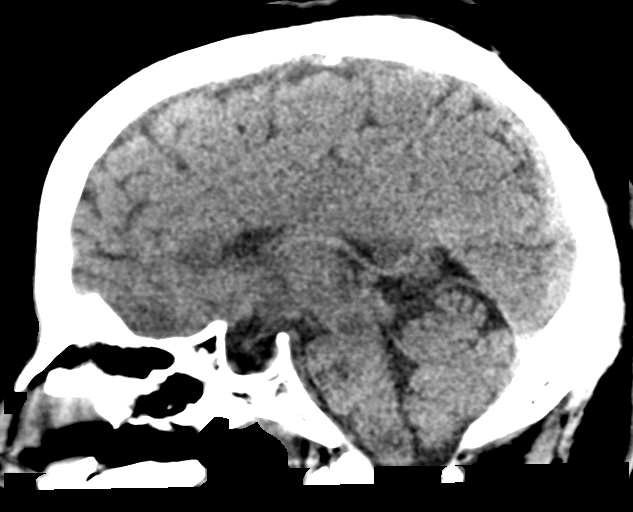
[im 45/67  brain]
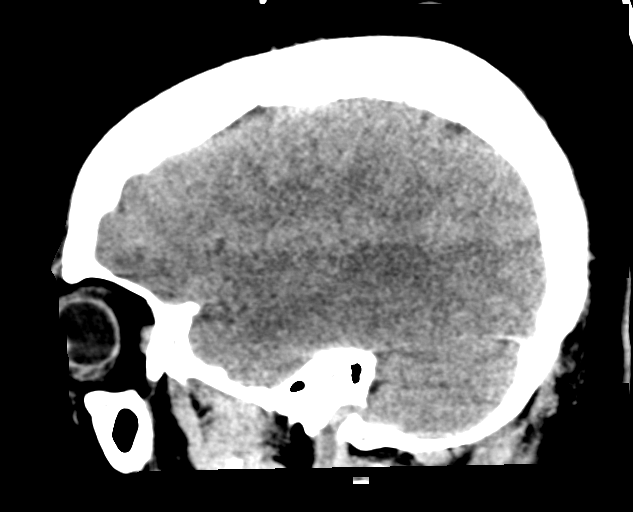

[16 of 47 positions shown; findings below may reference images not displayed]

FINDINGS: Brain: No acute cortical infarct, hemorrhage, or mass lesion
ispresent. Ventricles are of normal size. No significant extra-axial
fluid collection is present. The paranasal sinuses andmastoid air
cells are clear. The osseous skull is intact.

Vascular: No hyperdense vessel or unexpected calcification.

Skull: Normal. Negative for fracture or focal lesion.

Sinuses/Orbits: No acute finding.

Other: None.
IMPRESSION: 1. Normal brain.

## 2020-02-02 ENCOUNTER — Emergency Department (HOSPITAL_COMMUNITY)
Admission: EM | Admit: 2020-02-02 | Discharge: 2020-02-03 | Disposition: A | Discharge status: Home / Self Care | Payer: HRSA Program | Attending: Emergency Medicine | Admitting: Emergency Medicine

## 2020-02-02 ENCOUNTER — Other Ambulatory Visit: Payer: Self-pay

## 2020-02-02 ENCOUNTER — Encounter (HOSPITAL_COMMUNITY): Payer: Self-pay

## 2020-02-02 ENCOUNTER — Emergency Department (HOSPITAL_COMMUNITY): Payer: HRSA Program

## 2020-02-02 DIAGNOSIS — R042 Hemoptysis: Secondary | ICD-10-CM | POA: Diagnosis present

## 2020-02-02 DIAGNOSIS — U071 COVID-19: Secondary | ICD-10-CM | POA: Diagnosis not present

## 2020-02-02 DIAGNOSIS — J0110 Acute frontal sinusitis, unspecified: Secondary | ICD-10-CM | POA: Diagnosis not present

## 2020-02-02 LAB — COMPREHENSIVE METABOLIC PANEL
ALT: 18 U/L (ref 0–44)
AST: 19 U/L (ref 15–41)
Albumin: 4.4 g/dL (ref 3.5–5.0)
Alkaline Phosphatase: 51 U/L (ref 38–126)
Anion gap: 12 (ref 5–15)
BUN: 17 mg/dL (ref 6–20)
CO2: 27 mmol/L (ref 22–32)
Calcium: 9.8 mg/dL (ref 8.9–10.3)
Chloride: 103 mmol/L (ref 98–111)
Creatinine, Ser: 1.02 mg/dL — ABNORMAL HIGH (ref 0.44–1.00)
GFR, Estimated: >60 mL/min (ref >60)
Glucose, Bld: 97 mg/dL (ref 70–99)
Potassium: 3.8 mmol/L (ref 3.5–5.1)
Sodium: 142 mmol/L (ref 135–145)
Total Bilirubin: 0.6 mg/dL (ref 0.3–1.2)
Total Protein: 8.9 g/dL — ABNORMAL HIGH (ref 6.5–8.1)

## 2020-02-02 LAB — CBC
HCT: 39.2 % (ref 36.0–46.0)
Hemoglobin: 11.7 g/dL — ABNORMAL LOW (ref 12.0–15.0)
MCH: 24.2 pg — ABNORMAL LOW (ref 26.0–34.0)
MCHC: 29.8 g/dL — ABNORMAL LOW (ref 30.0–36.0)
MCV: 81.2 fL (ref 80.0–100.0)
Platelets: 251 10*3/uL (ref 150–400)
RBC: 4.83 MIL/uL (ref 3.87–5.11)
RDW: 14.9 % (ref 11.5–15.5)
WBC: 5.5 10*3/uL (ref 4.0–10.5)
nRBC: 0 % (ref 0.0–0.2)

## 2020-02-02 LAB — LIPASE, BLOOD: Lipase: 34 U/L (ref 11–51)

## 2020-02-02 LAB — URINALYSIS, ROUTINE W REFLEX MICROSCOPIC
Bilirubin Urine: NEGATIVE
Glucose, UA: NEGATIVE mg/dL
Ketones, ur: NEGATIVE mg/dL
Leukocytes,Ua: NEGATIVE
Nitrite: NEGATIVE
Protein, ur: NEGATIVE mg/dL
Specific Gravity, Urine: 1.024 (ref 1.005–1.030)
pH: 5 (ref 5.0–8.0)

## 2020-02-02 LAB — I-STAT BETA HCG BLOOD, ED (NOT ORDERABLE): I-stat hCG, quantitative: <5 m[IU]/mL (ref <5)

## 2020-02-02 MED ORDER — ACETAMINOPHEN 500 MG PO TABS
1000.0000 mg | ORAL_TABLET | Freq: Once | ORAL | Status: AC
  Administered 2020-02-03: 1000 mg via ORAL
  Filled 2020-02-02: qty 2

## 2020-02-02 NOTE — ED Triage Notes (Signed)
Pt arrived via walk in, states she has been coughing up blood this morning. Bright red. Denies any s/sx of diarrhea.

## 2020-02-02 NOTE — ED Provider Notes (Signed)
[facility]-EMERGENCY DEPT Provider Note  CSN: [id number] Arrival date & time: 02/02/20 1818  Chief Complaint(s) spitting blood  HPI Carla Beck is a 40 y.o. female with a past medical history listed below who presented to the emergency department with 1 day of spitting up blood.  Patient noted swelling upon awakening. Occurs occasionally. Not associated with coughing or emesis. Patient does report frontal and maxillary sinus pain that began few hours ago. No fevers or chills. She does endorse nasal congestion. No known sick contacts but works at a [employer]. No nausea vomiting. No other chest pain or shortness of breath. No abdominal pain. No other physical complaints.  HPI  Past Medical History Past Medical History:  Diagnosis Date  . Hypoglycemia    There are no problems to display for this patient.  Home Medication(s) Prior to Admission medications   Medication Sig Start Date End Date Taking? Authorizing Provider  ibuprofen (ADVIL,MOTRIN) 200 MG tablet Take 800 mg by mouth every 6 (six) hours as needed. Pain    [provider]  ondansetron (ZOFRAN ODT) 4 MG disintegrating tablet Take 1 tablet (4 mg total) by mouth every 8 (eight) hours as needed for nausea or vomiting. 04/04/15   Ward, Chase Picket, PA-C  oseltamivir (TAMIFLU) 75 MG capsule Take 1 capsule (75 mg total) by mouth every 12 (twelve) hours. 04/04/15   Ward, Chase Picket, PA-C                                                                                                                                    Past Surgical History Past Surgical History:  Procedure Laterality Date  . BRAIN SURGERY     Family History History reviewed. No pertinent family history.  Social History Social History   Tobacco Use  . Smoking status: Never Smoker  . Smokeless tobacco: Never Used  Substance Use Topics  . Alcohol use: No  . Drug use: No   Allergies Ceclor [cefaclor], Other, and  Penicillins  Review of Systems Review of Systems All other systems are reviewed and are negative for acute change except as noted in the HPI  Physical Exam Vital Signs  I have reviewed the triage vital signs BP 100/86   Pulse 76   Temp 99.7 F (37.6 C) (Oral)   Resp 16   SpO2 96%   Physical Exam Vitals reviewed.  Constitutional:      General: She is not in acute distress.    Appearance: She is well-developed and well-nourished. She is not diaphoretic.  HENT:     Head: Normocephalic and atraumatic.     Right Ear: External ear normal.     Left Ear: External ear normal.     Nose: Congestion present.     Right Nostril: No foreign body.     Left Nostril: No foreign body.     Right Turbinates: Swollen.     Right Sinus:  Maxillary sinus tenderness and frontal sinus tenderness present.     Left Sinus: Maxillary sinus tenderness and frontal sinus tenderness present.     Mouth/Throat:     Mouth: No oral lesions.     Tongue: No lesions.     Pharynx: No pharyngeal swelling or oropharyngeal exudate.     Comments: Cobblestoning Eyes:     General: No scleral icterus.    Extraocular Movements: EOM normal.     Conjunctiva/sclera: Conjunctivae normal.  Neck:     Trachea: Phonation normal.  Cardiovascular:     Rate and Rhythm: Normal rate and regular rhythm.  Pulmonary:     Effort: Pulmonary effort is normal. No respiratory distress.     Breath sounds: No stridor.  Abdominal:     General: There is no distension.  Musculoskeletal:        General: No edema. Normal range of motion.     Cervical back: Normal range of motion.  Neurological:     Mental Status: She is alert and oriented to person, place, and time.  Psychiatric:        Mood and Affect: Mood and affect normal.        Behavior: Behavior normal.     ED Results and Treatments Labs (all labs ordered are listed, but only abnormal results are displayed) Labs Reviewed  COMPREHENSIVE METABOLIC PANEL - Abnormal; Notable for  the following components:      Result Value   Creatinine, Ser 1.02 (*)    Total Protein 8.9 (*)    All other components within normal limits  CBC - Abnormal; Notable for the following components:   Hemoglobin 11.7 (*)    MCH 24.2 (*)    MCHC 29.8 (*)    All other components within normal limits  URINALYSIS, ROUTINE W REFLEX MICROSCOPIC - Abnormal; Notable for the following components:   APPearance HAZY (*)    Hgb urine dipstick MODERATE (*)    Bacteria, UA MANY (*)    All other components within normal limits  SARS CORONAVIRUS 2 (TAT 6-24 HRS)  LIPASE, BLOOD  I-STAT BETA HCG BLOOD, ED ([facility], [facility], [facility] ONLY)  I-STAT BETA HCG BLOOD, ED (NOT ORDERABLE)                                                                                                                         EKG  EKG Interpretation  Date/Time:    Ventricular Rate:    PR Interval:    QRS Duration:   QT Interval:    QTC Calculation:   R Axis:     Text Interpretation:        Radiology DG Chest 1 View  Result Date: 02/02/2020 CLINICAL DATA:  Shortness of breath, hemoptysis EXAM: CHEST  1 VIEW COMPARISON:  None. FINDINGS: No consolidation, features of edema, pneumothorax, or effusion. Pulmonary vascularity is normally distributed. The cardiomediastinal contours are unremarkable. No acute osseous or soft tissue abnormality. Bilateral metallic nipple ornamentation. IMPRESSION: No acute  cardiopulmonary abnormality. Electronically Signed   By: Kreg Shropshire M.D.   On: 02/02/2020 19:07    Pertinent labs & imaging results that were available during my care of the patient were reviewed by me and considered in my medical decision making (see chart for details).  Medications Ordered in ED Medications  acetaminophen (TYLENOL) tablet 1,000 mg (1,000 mg Oral Given 02/03/20 0000)                                                                                                                                     Procedures Procedures  (including critical care time)  Medical Decision Making / ED Course I have reviewed the nursing notes for this encounter and the patient's prior records (if available in EHR or on provided paperwork).   Kamorie Beck was evaluated in Emergency Department on 02/03/2020 for the symptoms described in the history of present illness. She was evaluated in the context of the global COVID-19 pandemic, which necessitated consideration that the patient might be at risk for infection with the SARS-CoV-2 virus that causes COVID-19. Institutional protocols and algorithms that pertain to the evaluation of patients at risk for COVID-19 are in a state of rapid change based on information released by regulatory bodies including the CDC and federal and state organizations. These policies and algorithms were followed during the patient's care in the ED.  Patient presents with blood in spit for 1 day. adequate oral hydration. Rest of history as above.  Patient appears well. No signs of toxicity, patient is interactive. No hypoxia, tachypnea or other signs of respiratory distress. No sign of clinical dehydration. Frontal and maxillary sinus pressure and tenderness. No active bleeding. Lung exam clear. Rest of exam as above.  Most consistent with viral sinusitis. No evidence suggestive of pharyngitis, AOM, PNA, or meningitis.  Labs reassuring without leukocytosis or significant anemia.  No significant electrolyte derangements or renal insufficiency.  Chest x-ray negative.  Testing for COVID.  Discussed symptomatic treatment with the patient and they will follow closely with their PCP.        Final Clinical Impression(s) / ED Diagnoses Final diagnoses:  Acute frontal sinusitis, recurrence not specified   The patient appears reasonably screened and/or stabilized for discharge and I doubt any other medical condition or other [facility] requiring further screening, evaluation, or  treatment in the ED at this time prior to discharge. Safe for discharge with strict return precautions.  Disposition: Discharge  Condition: Good  I have discussed the results, Dx and Tx plan with the patient/family who expressed understanding and agree(s) with the plan. Discharge instructions discussed at length. The patient/family was given strict return precautions who verbalized understanding of the instructions. No further questions at time of discharge.    ED Discharge Orders    None        Follow Up: Primary care provider  Call  in 5-7 days, If symptoms do not  improve or  worsen      This chart was dictated using voice recognition software.  Despite best efforts to proofread,  errors can occur which can change the documentation meaning.   Nira Conn, MD 02/03/20 [phone]

## 2020-02-02 NOTE — ED Notes (Signed)
Pink top with blood bank slip and bracelet sent with blood work if needed

## 2020-02-03 LAB — SARS CORONAVIRUS 2 (TAT 6-24 HRS): SARS Coronavirus 2: POSITIVE — AB

## 2020-02-05 ENCOUNTER — Telehealth: Payer: Self-pay | Admitting: Infectious Diseases

## 2020-02-05 NOTE — Telephone Encounter (Signed)
Called to discuss with patient about COVID-19 symptoms and the use of one of the available treatments for those with mild to moderate Covid symptoms and at a high risk of hospitalization.  Pt appears to qualify for outpatient treatment due to co-morbid conditions and/or a member of an at-risk group in accordance with the FDA Emergency Use Authorization.    Symptom onset: ?possibly 2/01 per ER encounters, but non-specific symptoms Vaccinated: Yes last dose 10-2019 Booster? Not elligible Immunocompromised? no Qualifiers: DM, BMI> 25  Unable to reach pt - LVM for call back to discuss possible ther   Rexene Alberts

## 2021-05-24 ENCOUNTER — Encounter (HOSPITAL_BASED_OUTPATIENT_CLINIC_OR_DEPARTMENT_OTHER): Payer: Self-pay | Admitting: Emergency Medicine

## 2021-05-24 ENCOUNTER — Emergency Department (HOSPITAL_BASED_OUTPATIENT_CLINIC_OR_DEPARTMENT_OTHER)
Admission: EM | Admit: 2021-05-24 | Discharge: 2021-05-24 | Disposition: A | Discharge status: Home / Self Care | Payer: 59 | Attending: Emergency Medicine | Admitting: Emergency Medicine

## 2021-05-24 ENCOUNTER — Other Ambulatory Visit: Payer: Self-pay

## 2021-05-24 ENCOUNTER — Emergency Department (HOSPITAL_BASED_OUTPATIENT_CLINIC_OR_DEPARTMENT_OTHER): Payer: 59

## 2021-05-24 DIAGNOSIS — R112 Nausea with vomiting, unspecified: Secondary | ICD-10-CM | POA: Diagnosis not present

## 2021-05-24 DIAGNOSIS — R519 Headache, unspecified: Secondary | ICD-10-CM | POA: Insufficient documentation

## 2021-05-24 LAB — BASIC METABOLIC PANEL
Anion gap: 9 (ref 5–15)
BUN: 11 mg/dL (ref 6–20)
CO2: 29 mmol/L (ref 22–32)
Calcium: 9.7 mg/dL (ref 8.9–10.3)
Chloride: 102 mmol/L (ref 98–111)
Creatinine, Ser: 0.81 mg/dL (ref 0.44–1.00)
GFR, Estimated: >60 mL/min (ref >60)
Glucose, Bld: 91 mg/dL (ref 70–99)
Potassium: 4.2 mmol/L (ref 3.5–5.1)
Sodium: 140 mmol/L (ref 135–145)

## 2021-05-24 LAB — CBC WITH DIFFERENTIAL/PLATELET
Abs Immature Granulocytes: 0.02 10*3/uL (ref 0.00–0.07)
Basophils Absolute: 0 10*3/uL (ref 0.0–0.1)
Basophils Relative: 1 %
Eosinophils Absolute: 0.1 10*3/uL (ref 0.0–0.5)
Eosinophils Relative: 3 %
HCT: 37.7 % (ref 36.0–46.0)
Hemoglobin: 11.5 g/dL — ABNORMAL LOW (ref 12.0–15.0)
Immature Granulocytes: 1 %
Lymphocytes Relative: 46 %
Lymphs Abs: 2 10*3/uL (ref 0.7–4.0)
MCH: 24 pg — ABNORMAL LOW (ref 26.0–34.0)
MCHC: 30.5 g/dL (ref 30.0–36.0)
MCV: 78.7 fL — ABNORMAL LOW (ref 80.0–100.0)
Monocytes Absolute: 0.4 10*3/uL (ref 0.1–1.0)
Monocytes Relative: 10 %
Neutro Abs: 1.6 10*3/uL — ABNORMAL LOW (ref 1.7–7.7)
Neutrophils Relative %: 39 %
Platelets: 208 10*3/uL (ref 150–400)
RBC: 4.79 MIL/uL (ref 3.87–5.11)
RDW: 15.8 % — ABNORMAL HIGH (ref 11.5–15.5)
WBC: 4.2 10*3/uL (ref 4.0–10.5)
nRBC: 0 % (ref 0.0–0.2)

## 2021-05-24 MED ORDER — DIPHENHYDRAMINE HCL 50 MG/ML IJ SOLN
25.0000 mg | Freq: Once | INTRAMUSCULAR | Status: DC
  Filled 2021-05-24: qty 1

## 2021-05-24 MED ORDER — METOCLOPRAMIDE HCL 5 MG/ML IJ SOLN
10.0000 mg | Freq: Once | INTRAMUSCULAR | Status: DC
  Filled 2021-05-24: qty 2

## 2021-05-24 MED ORDER — PROCHLORPERAZINE EDISYLATE 10 MG/2ML IJ SOLN
10.0000 mg | Freq: Once | INTRAMUSCULAR | Status: DC
  Filled 2021-05-24: qty 2

## 2021-05-24 MED ORDER — ACETAMINOPHEN 325 MG PO TABS
650.0000 mg | ORAL_TABLET | Freq: Once | ORAL | Status: AC
  Administered 2021-05-24: 650 mg via ORAL
  Filled 2021-05-24: qty 2

## 2021-05-24 MED ORDER — DIPHENHYDRAMINE HCL 25 MG PO CAPS
50.0000 mg | ORAL_CAPSULE | Freq: Once | ORAL | Status: AC
  Administered 2021-05-24: 50 mg via ORAL
  Filled 2021-05-24: qty 2

## 2021-05-24 NOTE — ED Notes (Signed)
ED Provider at bedside. 

## 2021-05-24 NOTE — ED Triage Notes (Signed)
Pt complains of both a headache and N/V for the past two weeks. Pt adds  she has been experiencing intermittent vision in her left eye.and  tingling in her left arm for the same amount of time.

## 2021-05-24 NOTE — ED Provider Notes (Signed)
[facility] EMERGENCY DEPT Provider Note   CSN: [id number] Arrival date & time: 05/24/21  7124     History  Chief Complaint  Patient presents with   Headache    Carla Beck is a 41 y.o. female.  Patient presents to ER chief complaint of headaches associated with nausea and vomiting.  Also having intermittent tingling on the left arm and intermittent vision changes with blurry vision off and on.  Symptoms intermittent for the past 2 to 3 weeks now.  She has a history of migraines and history of prolactinoma status post resection few years ago.  She used to see a neurologist but has not seen one in a while.      Home Medications Prior to Admission medications   Medication Sig Start Date End Date Taking? Authorizing Provider  ibuprofen (ADVIL,MOTRIN) 200 MG tablet Take 800 mg by mouth every 6 (six) hours as needed. Pain    [provider]  ondansetron (ZOFRAN ODT) 4 MG disintegrating tablet Take 1 tablet (4 mg total) by mouth every 8 (eight) hours as needed for nausea or vomiting. 04/04/15   Ward, Chase Picket, PA-C  oseltamivir (TAMIFLU) 75 MG capsule Take 1 capsule (75 mg total) by mouth every 12 (twelve) hours. Patient not taking: Reported on 05/24/2021 04/04/15   Ward, Chase Picket, PA-C      Allergies    Compazine [prochlorperazine], Ceclor [cefaclor], Other, and Penicillins    Review of Systems   Review of Systems  Constitutional:  Negative for fever.  HENT:  Negative for ear pain.   Eyes:  Negative for pain.  Respiratory:  Negative for cough.   Cardiovascular:  Negative for chest pain.  Gastrointestinal:  Negative for abdominal pain.  Genitourinary:  Negative for flank pain.  Musculoskeletal:  Negative for back pain.  Skin:  Negative for rash.  Neurological:  Positive for headaches.   Physical Exam Updated Vital Signs BP 121/72 (BP Location: Left Arm)   Pulse (!) 58   Temp 98.6 F (37 C) (Oral)   Resp 14   Ht 5\' 4"  (1.626 m)   Wt  104.3 kg   LMP 03/27/2021 (Approximate)   SpO2 94%   BMI 39.48 kg/m  Physical Exam Constitutional:      General: She is not in acute distress.    Appearance: Normal appearance.  HENT:     Head: Normocephalic.     Nose: Nose normal.  Eyes:     Extraocular Movements: Extraocular movements intact.  Cardiovascular:     Rate and Rhythm: Normal rate.  Pulmonary:     Effort: Pulmonary effort is normal.  Musculoskeletal:        General: Normal range of motion.     Cervical back: Normal range of motion.  Neurological:     General: No focal deficit present.     Mental Status: She is alert. Mental status is at baseline.    ED Results / Procedures / Treatments   Labs (all labs ordered are listed, but only abnormal results are displayed) Labs Reviewed  CBC WITH DIFFERENTIAL/PLATELET - Abnormal; Notable for the following components:      Result Value   Hemoglobin 11.5 (*)    MCV 78.7 (*)    MCH 24.0 (*)    RDW 15.8 (*)    Neutro Abs 1.6 (*)    All other components within normal limits  BASIC METABOLIC PANEL    EKG None  Radiology CT Head Wo Contrast  Result Date: 05/24/2021 CLINICAL  DATA:  Headache, chronic, new features or increased frequency. Additional history provided: Headache, nausea/vomiting, intermittent vision changes in left eye, tingling in left arm. EXAM: CT HEAD WITHOUT CONTRAST TECHNIQUE: Contiguous axial images were obtained from the base of the skull through the vertex without intravenous contrast. RADIATION DOSE REDUCTION: This exam was performed according to the departmental dose-optimization program which includes automated exposure control, adjustment of the mA and/or kV according to patient size and/or use of iterative reconstruction technique. COMPARISON:  Head CT 10/10/2015. FINDINGS: Brain: Cerebral volume is normal. "Empty" and somewhat expanded appearance of the sella turcica. There is no acute intracranial hemorrhage. No demarcated cortical infarct. No  extra-axial fluid collection. No evidence of an intracranial mass. No midline shift. Vascular: No hyperdense vessel. Skull: No fracture or aggressive osseous lesion. Sinuses/Orbits: No mass or acute finding within the imaged orbits. No significant paranasal sinus disease at the imaged levels. IMPRESSION: "Empty" and somewhat expanded appearance of the sella turcica. These findings can be associated with idiopathic intracranial hypertension (pseudotumor cerebri). Alternatively, these findings can reflect incidental anatomic variation. Otherwise unremarkable non-contrast CT appearance of the brain. Electronically Signed   By: Jackey Loge D.O.   On: 05/24/2021 11:05    Procedures Procedures    Medications Ordered in ED Medications  metoCLOPramide (REGLAN) injection 10 mg (has no administration in time range)  acetaminophen (TYLENOL) tablet 650 mg (650 mg Oral Given 05/24/21 1256)  diphenhydrAMINE (BENADRYL) capsule 50 mg (50 mg Oral Given 05/24/21 1309)    ED Course/ Medical Decision Making/ A&P                           Medical Decision Making Amount and/or Complexity of Data Reviewed Labs: ordered. Radiology: ordered.  Risk OTC drugs. Prescription drug management.   Chart review shows prior history of headaches and nausea for the past several years.  She is followed by her primary care doctor who pursued an MRI of her brain which was unremarkable.  Cardiac monitoring today showing sinus rhythm.  Sinus studies included CT scan labs.  CT scan done specific findings of possible intracranial hypertension, no acute findings noted.  Labs unremarkable today.  Patient was a very hard stick multiple attempts with ultrasound and multiple nurses unsuccessful.  Given medications IM and orally with some improvement of symptoms.  Patient states she had agitation reaction to Compazine and is unwilling to take Reglan or Compazine today.  States she prefers to follow-up with a neurologist as an  outpatient basis.  Declines neurology referral as she states that she prefers to see her old neurologist.  Her symptoms have been waxing and waning, currently no weakness noted, prior blurry vision appears improved per patient.  Recommended immediate return for worsening symptoms otherwise discharged home in stable condition.        Final Clinical Impression(s) / ED Diagnoses Final diagnoses:  Nonintractable headache, unspecified chronicity pattern, unspecified headache type    Rx / DC Orders ED Discharge Orders     None         Cheryll Cockayne, MD 05/24/21 1354

## 2021-05-24 NOTE — ED Notes (Signed)
Pt has left sided sensory deprivation. Grip strength is weaker in left hand  MD notified.

## 2021-05-24 NOTE — ED Notes (Signed)
Multiple attempts with RN's and MD with and without ultra sound unable to gain IV access.

## 2021-05-24 NOTE — ED Notes (Signed)
Patient transported to CT 

## 2021-05-24 NOTE — Discharge Instructions (Addendum)
I advise you to follow-up with the neurologist by calling their office today or tomorrow.  Return back to the ER if your symptoms worsen or if you have fevers or any additional concerns.

## 2021-11-14 ENCOUNTER — Encounter (HOSPITAL_BASED_OUTPATIENT_CLINIC_OR_DEPARTMENT_OTHER): Payer: Self-pay

## 2021-11-14 ENCOUNTER — Other Ambulatory Visit: Payer: Self-pay

## 2021-11-14 ENCOUNTER — Emergency Department (HOSPITAL_BASED_OUTPATIENT_CLINIC_OR_DEPARTMENT_OTHER)
Admission: EM | Admit: 2021-11-14 | Discharge: 2021-11-14 | Disposition: A | Discharge status: Home / Self Care | Payer: 59 | Attending: Emergency Medicine | Admitting: Emergency Medicine

## 2021-11-14 ENCOUNTER — Emergency Department (HOSPITAL_BASED_OUTPATIENT_CLINIC_OR_DEPARTMENT_OTHER): Payer: 59

## 2021-11-14 DIAGNOSIS — M25561 Pain in right knee: Secondary | ICD-10-CM | POA: Diagnosis not present

## 2021-11-14 DIAGNOSIS — K760 Fatty (change of) liver, not elsewhere classified: Secondary | ICD-10-CM | POA: Diagnosis not present

## 2021-11-14 DIAGNOSIS — M25562 Pain in left knee: Secondary | ICD-10-CM | POA: Insufficient documentation

## 2021-11-14 DIAGNOSIS — Z1152 Encounter for screening for COVID-19: Secondary | ICD-10-CM | POA: Diagnosis not present

## 2021-11-14 DIAGNOSIS — R531 Weakness: Secondary | ICD-10-CM | POA: Diagnosis present

## 2021-11-14 DIAGNOSIS — K529 Noninfective gastroenteritis and colitis, unspecified: Secondary | ICD-10-CM | POA: Diagnosis not present

## 2021-11-14 LAB — COMPREHENSIVE METABOLIC PANEL
ALT: 109 U/L — ABNORMAL HIGH (ref 0–44)
AST: 128 U/L — ABNORMAL HIGH (ref 15–41)
Albumin: 4.5 g/dL (ref 3.5–5.0)
Alkaline Phosphatase: 56 U/L (ref 38–126)
Anion gap: 11 (ref 5–15)
BUN: 10 mg/dL (ref 6–20)
CO2: 28 mmol/L (ref 22–32)
Calcium: 9.6 mg/dL (ref 8.9–10.3)
Chloride: 102 mmol/L (ref 98–111)
Creatinine, Ser: 0.75 mg/dL (ref 0.44–1.00)
GFR, Estimated: >60 mL/min (ref >60)
Glucose, Bld: 90 mg/dL (ref 70–99)
Potassium: 4 mmol/L (ref 3.5–5.1)
Sodium: 141 mmol/L (ref 135–145)
Total Bilirubin: 0.4 mg/dL (ref 0.3–1.2)
Total Protein: 8.2 g/dL — ABNORMAL HIGH (ref 6.5–8.1)

## 2021-11-14 LAB — CBC
HCT: 37.1 % (ref 36.0–46.0)
Hemoglobin: 11.6 g/dL — ABNORMAL LOW (ref 12.0–15.0)
MCH: 24.6 pg — ABNORMAL LOW (ref 26.0–34.0)
MCHC: 31.3 g/dL (ref 30.0–36.0)
MCV: 78.8 fL — ABNORMAL LOW (ref 80.0–100.0)
Platelets: 218 K/uL (ref 150–400)
RBC: 4.71 MIL/uL (ref 3.87–5.11)
RDW: 15.9 % — ABNORMAL HIGH (ref 11.5–15.5)
WBC: 4.8 K/uL (ref 4.0–10.5)
nRBC: 0 % (ref 0.0–0.2)

## 2021-11-14 LAB — RESP PANEL BY RT-PCR (FLU A&B, COVID) ARPGX2
Influenza A by PCR: NEGATIVE
Influenza B by PCR: NEGATIVE
SARS Coronavirus 2 by RT PCR: NEGATIVE

## 2021-11-14 LAB — LIPASE, BLOOD: Lipase: 24 U/L (ref 11–51)

## 2021-11-14 MED ORDER — ONDANSETRON HCL 4 MG/2ML IJ SOLN
4.0000 mg | Freq: Once | INTRAMUSCULAR | Status: AC
  Administered 2021-11-14: 4 mg via INTRAVENOUS
  Filled 2021-11-14: qty 2

## 2021-11-14 MED ORDER — SODIUM CHLORIDE 0.9 % IV BOLUS
1000.0000 mL | Freq: Once | INTRAVENOUS | Status: AC
  Administered 2021-11-14: 1000 mL via INTRAVENOUS

## 2021-11-14 MED ORDER — METOCLOPRAMIDE HCL 5 MG/ML IJ SOLN
5.0000 mg | Freq: Once | INTRAMUSCULAR | Status: AC
  Administered 2021-11-14: 5 mg via INTRAVENOUS
  Filled 2021-11-14: qty 2

## 2021-11-14 MED ORDER — ACETAMINOPHEN 500 MG PO TABS
1000.0000 mg | ORAL_TABLET | Freq: Once | ORAL | Status: AC
  Administered 2021-11-14: 1000 mg via ORAL
  Filled 2021-11-14: qty 2

## 2021-11-14 MED ORDER — IOHEXOL 300 MG/ML  SOLN
100.0000 mL | Freq: Once | INTRAMUSCULAR | Status: AC | PRN
  Administered 2021-11-14: 100 mL via INTRAVENOUS

## 2021-11-14 MED ORDER — DIPHENHYDRAMINE HCL 50 MG/ML IJ SOLN
25.0000 mg | Freq: Once | INTRAMUSCULAR | Status: AC
  Administered 2021-11-14: 25 mg via INTRAVENOUS
  Filled 2021-11-14: qty 1

## 2021-11-14 NOTE — ED Provider Notes (Signed)
[facility] EMERGENCY DEPT Provider Note   CSN: [id number] Arrival date & time: 11/14/21  1015     History  Chief Complaint  Patient presents with   Weakness    Carla Beck is a 41 y.o. female with a prior history of macroprolactinoma s/p resection, on carbergolin, migraines, and prior history of intermittent nausea and vomiting presenting to the ED with a 2-week history of overall weakness  Patient reports this started about 2 weeks ago after a couple of days of sinus pressure and rhinorrhea that resolved 2 days after onset. She also reports having diarrhea for the past 5 days with and without food, with pain to her L lower abdomen and her L flank.  No blood or dark tarry stools per patient. Since she had felt there an overall feeling of bilateral lower extremity tingling and fatigue. She also has felt sleepier than usual, intolerant of cold weather, and feeling tired. She has also endorsed increased nausea, nonbiliousnonbloody vomiting, and inability to tolerate PO intake for the past 2 days. She's currently endorsing a HA that began since her last episode of vomiting this morning. She did not take her usual migraine medications or zofran as she knew she would be seen in the ED.  Patient denies fevers, changes in vision, facial asymmetry, changes in mental status, balance, inability to ambulate, saddle anesthesia or urinary or fecal incontinence. Patient denies recent change in medications. Her primary care physician is at [facility].   Weakness Severity:  Mild Onset quality:  Gradual Progression:  Unchanged Chronicity:  New Context: dehydration and recent infection   Context: not alcohol use, not decreased sleep and not pinched nerve   Associated symptoms: abdominal pain, anorexia, arthralgias, diarrhea, headaches, nausea and vomiting   Associated symptoms: no aphasia, no ataxia, no chest pain, no cough, no difficulty walking, no dizziness, no drooling, no dysphagia, no  dysuria, no numbness in extremities, no falls, no fever, no foul-smelling urine, no frequency, no hematochezia, no lethargy, no loss of consciousness, no melena, no near-syncope, no seizures, no sensory-motor deficit, no shortness of breath, no stroke symptoms, no syncope, no urgency and no vision change   Abdominal pain:    Location:  LLQ Diarrhea:    Quality:  Unable to specify   Number of occurrences:  >8   Severity:  Moderate   Timing:  Intermittent   Progression:  Unchanged Headaches:    Severity:  Mild   Timing:  Intermittent   Chronicity:  Recurrent Nausea:    Severity:  Moderate   Timing:  Intermittent   Progression:  Unchanged Vomiting:    Quality:  Stomach contents   Duration:  5 days   Progression:  Unable to specify      Home Medications Prior to Admission medications   Medication Sig Start Date End Date Taking? Authorizing Provider  ibuprofen (ADVIL,MOTRIN) 200 MG tablet Take 800 mg by mouth every 6 (six) hours as needed. Pain    [provider]  ondansetron (ZOFRAN ODT) 4 MG disintegrating tablet Take 1 tablet (4 mg total) by mouth every 8 (eight) hours as needed for nausea or vomiting. 04/04/15   Ward, Chase Picket, PA-C  oseltamivir (TAMIFLU) 75 MG capsule Take 1 capsule (75 mg total) by mouth every 12 (twelve) hours. Patient not taking: Reported on 05/24/2021 04/04/15   Ward, Chase Picket, PA-C      Allergies    Compazine [prochlorperazine], Ceclor [cefaclor], Other, and Penicillins    Review of Systems   Review of  Systems  Constitutional:  Positive for chills and fatigue. Negative for appetite change, fever and unexpected weight change.  HENT: Negative.  Negative for drooling.   Respiratory:  Negative for cough and shortness of breath.   Cardiovascular:  Negative for chest pain, syncope and near-syncope.  Gastrointestinal:  Positive for abdominal pain, anorexia, diarrhea, nausea and vomiting. Negative for dysphagia, hematochezia and melena.   Genitourinary:  Negative for dysuria, frequency and urgency.  Musculoskeletal:  Positive for arthralgias. Negative for falls.  Neurological:  Positive for weakness and headaches. Negative for dizziness, seizures and loss of consciousness.    Physical Exam Updated Vital Signs BP 102/67   Pulse 62   Temp 98.9 F (37.2 C)   Resp 18   Ht 5\' 3"  (1.6 m)   Wt 108.9 kg   SpO2 96%   BMI 42.51 kg/m  Physical Exam Constitutional:      General: She is not in acute distress.    Appearance: Normal appearance. She is obese. She is not ill-appearing or toxic-appearing.  Eyes:     Extraocular Movements: Extraocular movements intact.     Conjunctiva/sclera: Conjunctivae normal.     Pupils: Pupils are equal, round, and reactive to light.  Cardiovascular:     Rate and Rhythm: Normal rate and regular rhythm.     Pulses: Normal pulses.  Pulmonary:     Effort: Pulmonary effort is normal. No respiratory distress.     Breath sounds: Normal breath sounds. No stridor. No wheezing.  Abdominal:     General: Bowel sounds are normal. There is no distension.     Palpations: Abdomen is soft.     Tenderness: There is no guarding or rebound.     Comments: Tenderness over LLQ and L flank  Musculoskeletal:        General: No swelling, deformity or signs of injury. Normal range of motion.     Cervical back: Normal range of motion and neck supple.     Comments: Bilateral lower knee tenderness. No effusion or edema   Skin:    General: Skin is warm and dry.     Findings: No lesion or rash.  Neurological:     General: No focal deficit present.     Mental Status: She is alert and oriented to person, place, and time.     Cranial Nerves: No cranial nerve deficit.     Sensory: No sensory deficit.     Motor: No weakness.  Psychiatric:        Mood and Affect: Mood normal.        Behavior: Behavior normal.        Thought Content: Thought content normal.        Judgment: Judgment normal.     ED Results /  Procedures / Treatments   Labs (all labs ordered are listed, but only abnormal results are displayed) Labs Reviewed  CBC - Abnormal; Notable for the following components:      Result Value   Hemoglobin 11.6 (*)    MCV 78.8 (*)    MCH 24.6 (*)    RDW 15.9 (*)    All other components within normal limits  COMPREHENSIVE METABOLIC PANEL - Abnormal; Notable for the following components:   Total Protein 8.2 (*)    AST 128 (*)    ALT 109 (*)    All other components within normal limits  RESP PANEL BY RT-PCR (FLU A&B, COVID) ARPGX2  LIPASE, BLOOD  URINALYSIS, ROUTINE W REFLEX MICROSCOPIC  PREGNANCY,  URINE    EKG None  Radiology CT ABDOMEN PELVIS W CONTRAST  Result Date: 11/14/2021 CLINICAL DATA:  Pain left lower quadrant of abdomen EXAM: CT ABDOMEN AND PELVIS WITH CONTRAST TECHNIQUE: Multidetector CT imaging of the abdomen and pelvis was performed using the standard protocol following bolus administration of intravenous contrast. RADIATION DOSE REDUCTION: This exam was performed according to the departmental dose-optimization program which includes automated exposure control, adjustment of the mA and/or kV according to patient size and/or use of iterative reconstruction technique. CONTRAST:  OMNIPAQUE IOHEXOL 300 MG/ML  SOLN COMPARISON:  None Available. FINDINGS: Lower chest: Unremarkable. Hepatobiliary: Liver measures 21.2 cm in length. There is fatty infiltration. There is no dilation of bile ducts. Gallbladder is unremarkable. Pancreas: Unremarkable. Spleen: Unremarkable Adrenals/Urinary Tract: Adrenals are not enlarged. There is no hydronephrosis. There are no renal or ureteral stones. Urinary bladder is unremarkable. Stomach/Bowel: Stomach is unremarkable. Small bowel loops are not dilated. Appendix is not dilated. Appendix is seen in right pelvic cavity. There is no significant wall thickening in colon. There is no pericolic stranding. Vascular/Lymphatic: Unremarkable. Reproductive:  Unremarkable. Other: There is no ascites or pneumoperitoneum. Umbilical hernia containing fat is seen. Musculoskeletal: Unremarkable. IMPRESSION: There is no evidence of intestinal obstruction or pneumoperitoneum. There is no hydronephrosis. Appendix is not dilated. Enlarged fatty liver.  Umbilical hernia containing fat. Electronically Signed   By: Ernie Avena M.D.   On: 11/14/2021 14:30    Procedures Procedures    Medications Ordered in ED Medications  ondansetron (ZOFRAN) injection 4 mg (4 mg Intravenous Given 11/14/21 1316)  acetaminophen (TYLENOL) tablet 1,000 mg (1,000 mg Oral Given 11/14/21 1312)  sodium chloride 0.9 % bolus 1,000 mL (0 mLs Intravenous Stopped 11/14/21 1428)  diphenhydrAMINE (BENADRYL) injection 25 mg (25 mg Intravenous Given 11/14/21 1313)  metoCLOPramide (REGLAN) injection 5 mg (5 mg Intravenous Given 11/14/21 1317)  iohexol (OMNIPAQUE) 300 MG/ML solution 100 mL (100 mLs Intravenous Contrast Given 11/14/21 1403)    ED Course/ Medical Decision Making/ A&P                           Medical Decision Making Amount and/or Complexity of Data Reviewed Labs: ordered. Radiology: ordered.  Risk OTC drugs. Prescription drug management.   Patient presenting with overall malaise, generalized weakness, abdominal pain, n/v. Differential diagnosis include viral gastroenteritis, diverticulitis, pyelonephritis, nephrolithiases, pancreatitis.   Given physical exam and clinical presentation, suspect diverticulitis vs gastroenteritis. Will order cbc, cmp, lipase, U/A and CT abdomen and pelvis as differential is broad. Patient is also endorsing a HA; There is no gross focal deficit on neurological examination. Patient is currently conversing appropriately. Given this, not inclined to order CT imaging. Patient is also ambulating without difficulty and no ataxia is appreciated on exam. Given history of migraines will treat with cocktail including Reglan, benadryl, tylenol. She  also received 1L bolus of normal saline.   Thus far, Hgb stable from prior. No leukocytosis. Renal function and lipase wnl. On re-evaluation, patient is feeling better from HA and abdominal pain stand point.   Liver function panel significant for increased transaminases with AST 128 and ALT 109. CT scan with enlarged fatty liver, thus increased liver enzymes likely in the setting of same. No evidence of appendicitis, renal stone, or hydronephrosis. No evidence of wall thickening to suggest overt colitis.   Suspect patient's acute presentation is likely in the setting of gastroenteritis. She would benefit from PCP follow up to broaden differential including  TSH, iron studies, if her symptoms persist.  Discussed CT results with patient; she would also benefit from following up LFTs and monitoring. Discussed precautionary symptoms for return with patient. She is stable for discharge home.  Final Clinical Impression(s) / ED Diagnoses Final diagnoses:  Fatty liver  Gastroenteritis    Rx / DC Orders ED Discharge Orders     None         Morene CrockerGomez-Caraballo, Tyauna Lacaze, MD 11/14/21 1535    Melene PlanFloyd, Dan, DO 11/15/21 1313

## 2021-11-14 NOTE — ED Triage Notes (Signed)
Pt arrived POV from home. Pt c/o generalized weakness, fatigue, and bodyaches x2 weeks which she states was after having "cold like s/s" but was tested for COVID and was neg. Pt also c/o intermittent N/V over the past week and onset of headache today. Pt last took Ibuprofen last night.

## 2021-11-14 NOTE — Discharge Instructions (Addendum)
You were seen in the ED for general weakness, nausea, vomiting, and diarrhea. We did a complete exam, ran blood tests, and did a CT scan. We did not find an acute cause to explain all of your symptoms, but suspect that you have something called gastroenteritis that could cause nausea, vomiting, diarrhea, and the general weakness you have been feeling.   Your electrolytes and your renal function is okay. Your blood count is mildly decrease which could cause some of the fatigue you feel.  I am attacking some information for conservative treatment; however, if this does not improve, you should be seen by your primary care provider to order laboratory tests to further evaluate you. You can continue taking Zofran as needed for nausea.  Something else we found on your work up is that your liver enzymes and the size of your liver is large. It is called is fatty liver. It would be important for your primary care physician to continue to monitor these changes.  Please return to the hospital if your are unable to manage your symptoms, are unable to drink or eat, or if you develop worsening weakness, new fever, or generally feel worse.   Take care, Carla Crocker, MD

## 2021-11-14 NOTE — ED Notes (Signed)
Patient transported to Radiology 

## 2021-11-14 NOTE — ED Notes (Signed)
Pt stated that c/o more on tingling in both lower limbs and pain in her bones.

## 2022-02-08 DIAGNOSIS — H579 Unspecified disorder of eye and adnexa: Secondary | ICD-10-CM | POA: Insufficient documentation

## 2022-02-08 DIAGNOSIS — Z5321 Procedure and treatment not carried out due to patient leaving prior to being seen by health care provider: Secondary | ICD-10-CM | POA: Insufficient documentation

## 2022-02-08 DIAGNOSIS — H538 Other visual disturbances: Secondary | ICD-10-CM | POA: Diagnosis not present

## 2022-02-09 ENCOUNTER — Encounter (HOSPITAL_BASED_OUTPATIENT_CLINIC_OR_DEPARTMENT_OTHER): Payer: Self-pay | Admitting: Emergency Medicine

## 2022-02-09 ENCOUNTER — Emergency Department (HOSPITAL_BASED_OUTPATIENT_CLINIC_OR_DEPARTMENT_OTHER)
Admission: EM | Admit: 2022-02-09 | Discharge: 2022-02-09 | Discharge status: Against Medical Advice | Payer: 59 | Attending: Emergency Medicine | Admitting: Emergency Medicine

## 2022-02-09 NOTE — ED Triage Notes (Signed)
Accidentally place ear pain drops in left eye instead of eye drops. Approximately 11:30pm Flushed with water for 15 minutes at home. Continue blurry vision and irritation.  Patient called poison control and was advised to be evaluated in ed  Appears red and irritated in triage

## 2022-08-16 IMAGING — CR DG CHEST 1V
1 series · 1 of 1 positions shown · non-contrast
Comparison: None.

CLINICAL DATA: Shortness of breath, hemoptysis

EXAM:
CHEST  1 VIEW

[w chest pa]
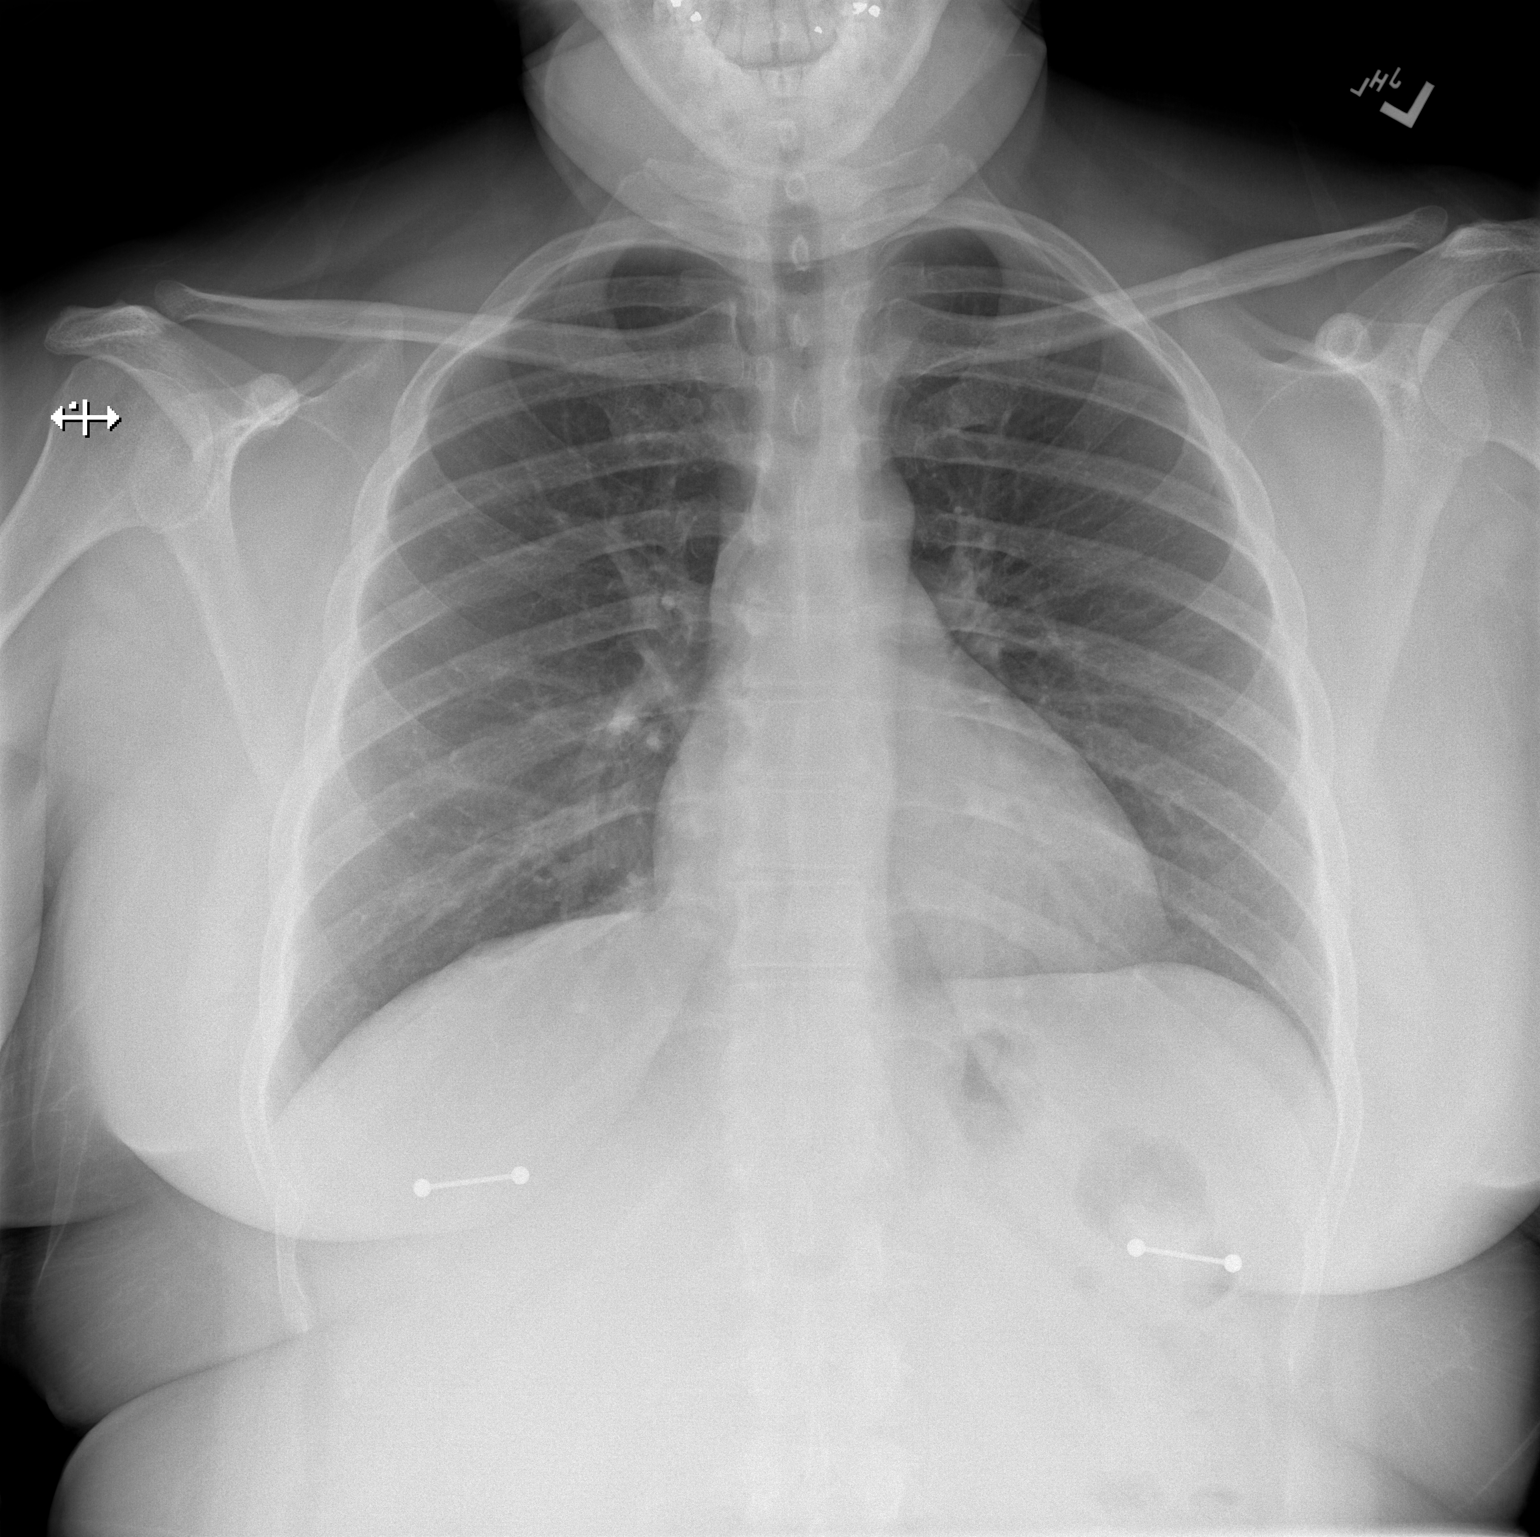

[1 of 1 positions shown; findings below may reference images not displayed]

FINDINGS: No consolidation, features of edema, pneumothorax, or effusion.
Pulmonary vascularity is normally distributed. The cardiomediastinal
contours are unremarkable. No acute osseous or soft tissue
abnormality. Bilateral metallic nipple ornamentation.
IMPRESSION: No acute cardiopulmonary abnormality.
